# Patient Record
Sex: Female | Born: 1945 | Race: White | Hispanic: No | Smoking: Never smoker
Health system: Southern US, Community
[De-identification: ages and names within clinical notes are randomized; demographics above are authoritative.]

## PROBLEM LIST (undated history)

## (undated) DIAGNOSIS — Z889 Allergy status to unspecified drugs, medicaments and biological substances status: Secondary | ICD-10-CM

## (undated) DIAGNOSIS — M1990 Unspecified osteoarthritis, unspecified site: Secondary | ICD-10-CM

## (undated) DIAGNOSIS — Z9289 Personal history of other medical treatment: Secondary | ICD-10-CM

## (undated) DIAGNOSIS — I10 Essential (primary) hypertension: Secondary | ICD-10-CM

## (undated) DIAGNOSIS — K219 Gastro-esophageal reflux disease without esophagitis: Secondary | ICD-10-CM

## (undated) DIAGNOSIS — H3530 Unspecified macular degeneration: Secondary | ICD-10-CM

## (undated) HISTORY — PX: BREAST SURGERY: SHX581

## (undated) HISTORY — PX: TONSILLECTOMY: SUR1361

## (undated) HISTORY — PX: FOOT SURGERY: SHX648

## (undated) HISTORY — PX: OTHER SURGICAL HISTORY: SHX169

## (undated) HISTORY — PX: CERVICAL FUSION: SHX112

## (undated) HISTORY — PX: ABDOMINAL HYSTERECTOMY: SHX81

## (undated) HISTORY — PX: BACK SURGERY: SHX140

---

## 2014-01-22 ENCOUNTER — Encounter (HOSPITAL_COMMUNITY): Payer: Self-pay | Admitting: Pharmacy Technician

## 2014-01-23 NOTE — H&P (Signed)
TOTAL HIP ADMISSION H&P  Patient is admitted for right total hip arthroplasty.  Subjective:  Chief Complaint:   Right hip OA / pain  HPI: Karen Li, 68 y.o. female, has a history of pain and functional disability in the right hip(s) due to arthritis and patient has failed non-surgical conservative treatments for greater than 12 weeks to include NSAID's and/or analgesics, use of assistive devices and activity modification.  Onset of symptoms was abrupt starting in May 2015 with rapidlly worsening course since that time.The patient noted no past surgery on the right hip(s).  Patient currently rates pain in the right hip at 8 out of 10 with activity. Patient has night pain, worsening of pain with activity and weight bearing, trendelenberg gait, pain that interfers with activities of daily living and pain with passive range of motion. Patient has evidence of periarticular osteophytes and joint space narrowing by imaging studies. This condition presents safety issues increasing the risk of falls.  There is no current active infection.  Risks, benefits and expectations were discussed with the patient.  Risks including but not limited to the risk of anesthesia, blood clots, nerve damage, blood vessel damage, failure of the prosthesis, infection and up to and including death.  Patient understand the risks, benefits and expectations and wishes to proceed with surgery.   PCP: No primary provider on file.  D/C Plans:      Home with HHPT  Post-op Meds:       No Rx given  Tranexamic Acid:      To be given - IV    Decadron:      Is to be given  FYI:     ASA post-op   Norco post-op   Past Medical History  Diagnosis Date  . Hypertension   . H/O seasonal allergies   . GERD (gastroesophageal reflux disease)     tx. Prevacid" due to Indomethacin use"  . Arthritis     Osteoarthritis- hip, shoulders, fingers  . Transfusion history     9'13 -s/p cervical fusion  . 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 Tonsillectomy    . Breast surgery      '82- bil. breast cystectomy-benign  . Foot surgery Left     8'06-retained screw left heel.   . Back surgery      1'61,0'96 - Lumbar fusion -rods/ screws retained  . Cervical fusion      9'13 Cervical fusion [place] -retained plates/screws  . Abdominal hysterectomy      7'15 - [facility]-"prolapse"  . Cataract surgery Bilateral     "legally blind" can't read , can see limited     No Known Allergies   History  Substance Use Topics  . Smoking status: Never Smoker   . Smokeless tobacco: Not on file  . Alcohol Use: No     Comment: rare -social      Review of Systems  Constitutional: Negative.   HENT: Negative.   Eyes: Positive for blurred vision.  Respiratory: Negative.   Cardiovascular: Negative.   Gastrointestinal: Negative.   Genitourinary: Negative.   Musculoskeletal: Positive for joint pain.  Skin: Negative.   Neurological: Negative.   Endo/Heme/Allergies: Negative.   Psychiatric/Behavioral: Negative.     Objective:  Physical Exam  Constitutional: She is oriented to person, place, and time. She appears well-developed and well-nourished.  HENT:  Head: Normocephalic and atraumatic.  Eyes: Pupils are equal, round, and reactive to light.  Neck: Neck supple. No JVD present. No tracheal deviation present. No thyromegaly present.  Cardiovascular: Normal rate, regular rhythm, normal heart sounds and intact distal pulses.   Respiratory: Effort normal and breath sounds normal. No stridor. No respiratory distress. She has no wheezes.  GI: Soft. There is no tenderness. There is no guarding.  Musculoskeletal:       Right hip: She exhibits decreased range of motion, decreased strength, tenderness and bony tenderness. She exhibits no crepitus, no deformity and no laceration.  Lymphadenopathy:    She has no cervical adenopathy.   Neurological: She is alert and oriented to person, place, and time.  Skin: Skin is warm and dry.  Psychiatric: She has a normal mood and affect.     Imaging Review Plain radiographs demonstrate severe degenerative joint disease of the right hip(s). The bone quality appears to be good for age and reported activity level.  Assessment/Plan:  End stage arthritis, right hip(s)  The patient history, physical examination, clinical judgement of the provider and imaging studies are consistent with end stage degenerative joint disease of the right hip(s) and total hip arthroplasty is deemed medically necessary. The treatment options including medical management, injection therapy, arthroscopy and arthroplasty were discussed at length. The risks and benefits of total hip arthroplasty were presented and reviewed. The risks due to aseptic loosening, infection, stiffness, dislocation/subluxation,  thromboembolic complications and other imponderables were discussed.  The patient acknowledged the explanation, agreed to proceed with the plan and consent was signed. Patient is being admitted for inpatient treatment for surgery, pain control, PT, OT, prophylactic antibiotics, VTE prophylaxis, progressive ambulation and ADL's and discharge planning.The patient is planning to be discharged home with home health services.      Anastasio AuerbachMatthew S. Liborio Saccente   PA-C  01/23/2014, 5:44 PM

## 2014-01-23 NOTE — Patient Instructions (Addendum)
Karen Li  01/23/2014   Your procedure is scheduled on:  02/05/2014 Tuesday  -  [id number]  [facility].  Follow the Signs to Short Stay Center at 0515  am  Call this number if you have problems the morning of surgery: [phone]  01-25-14 HCPOA form provided today and placed with chart.   Remember:   Do not eat food or drink liquids after midnight.   Take these medicines the morning of surgery with A SIP OF WATER: Norvasc. Lipitor. Norco. Prevacid. Effexor. Use Afrin as needed(not while using Mupirocin ointment).   Do not wear jewelry, make-up or nail polish.  Do not wear lotions, powders, or perfumes. , deodorant   Do not shave 48 hours prior to surgery.   Do not bring valuables to the hospital.  Contacts, dentures or bridgework may not be worn into surgery.  Leave suitcase in the car. After surgery it may be brought to your room.  For patients admitted to the hospital, checkout time is 11:00 AM the day of discharge.   Transportation for discharge: Peg Daleen SnookFinlay, daughter - [phone] cell    [facility] - Preparing for Surgery Before surgery, you can play an important role.  Because skin is not sterile, your skin needs to be as free of germs as possible.  You can reduce the number of germs on your skin by washing with CHG (chlorahexidine gluconate) soap before surgery.  CHG is an antiseptic cleaner which kills germs and bonds with the skin to continue killing germs even after washing. Please DO NOT use if you have an allergy to CHG or antibacterial soaps.  If your skin becomes reddened/irritated stop using the CHG and inform your nurse when you arrive at Short Stay. Do not shave (including legs and underarms) for at least 48 hours prior to the first CHG shower.  You may shave your face/neck. Please follow these instructions carefully:  1.  Shower with CHG Soap the night before surgery and the  morning of Surgery.  2.  If you choose to wash your hair, wash your  hair first as usual with your  normal  shampoo.  3.  After you shampoo, rinse your hair and body thoroughly to remove the  shampoo.                           4.  Use CHG as you would any other liquid soap.  You can apply chg directly  to the skin and wash                       Gently with a scrungie or clean washcloth.  5.  Apply the CHG Soap to your body ONLY FROM THE NECK DOWN.   Do not use on face/ open                           Wound or open sores. Avoid contact with eyes, ears mouth and genitals (private parts).                       Wash face,  Genitals (private parts) with your normal soap.             6.  Wash thoroughly, paying special attention to the area where your surgery  will be performed.  7.  Thoroughly rinse your body with warm water  from the neck down.  8.  DO NOT shower/wash with your normal soap after using and rinsing off  the CHG Soap.                9.  Pat yourself dry with a clean towel.            10.  Wear clean pajamas.            11.  Place clean sheets on your bed the night of your first shower and do not  sleep with pets. Day of Surgery : Do not apply any lotions/deodorants the morning of surgery.  Please wear clean clothes to the hospital/surgery center.  FAILURE TO FOLLOW THESE INSTRUCTIONS MAY RESULT IN THE CANCELLATION OF YOUR SURGERY PATIENT SIGNATURE_________________________________  NURSE SIGNATURE__________________________________  ________________________________________________________________________  WHAT IS A BLOOD TRANSFUSION? Blood Transfusion Information  A transfusion is the replacement of blood or some of its parts. Blood is made up of multiple cells which provide different functions.  Red blood cells carry oxygen and are used for blood loss replacement.  White blood cells fight against infection.  Platelets control bleeding.  Plasma helps clot blood.  Other blood products are available for specialized needs, such as hemophilia or  other clotting disorders. BEFORE THE TRANSFUSION  Who gives blood for transfusions?   Healthy volunteers who are fully evaluated to make sure their blood is safe. This is blood bank blood. Transfusion therapy is the safest it has ever been in the practice of medicine. Before blood is taken from a donor, a complete history is taken to make sure that person has no history of diseases nor engages in risky social behavior (examples are intravenous drug use or sexual activity with multiple partners). The donor's travel history is screened to minimize risk of transmitting infections, such as malaria. The donated blood is tested for signs of infectious diseases, such as HIV and hepatitis. The blood is then tested to be sure it is compatible with you in order to minimize the chance of a transfusion reaction. If you or a relative donates blood, this is often done in anticipation of surgery and is not appropriate for emergency situations. It takes many days to process the donated blood. RISKS AND COMPLICATIONS Although transfusion therapy is very safe and saves many lives, the main dangers of transfusion include:   Getting an infectious disease.  Developing a transfusion reaction. This is an allergic reaction to something in the blood you were given. Every precaution is taken to prevent this. The decision to have a blood transfusion has been considered carefully by your caregiver before blood is given. Blood is not given unless the benefits outweigh the risks. AFTER THE TRANSFUSION  Right after receiving a blood transfusion, you will usually feel much better and more energetic. This is especially true if your red blood cells have gotten low (anemic). The transfusion raises the level of the red blood cells which carry oxygen, and this usually causes an energy increase.  The nurse administering the transfusion will monitor you carefully for complications. HOME CARE INSTRUCTIONS  No special instructions are  needed after a transfusion. You may find your energy is better. Speak with your caregiver about any limitations on activity for underlying diseases you may have. SEEK MEDICAL CARE IF:   Your condition is not improving after your transfusion.  You develop redness or irritation at the intravenous (IV) site. SEEK IMMEDIATE MEDICAL CARE IF:  Any of the following symptoms occur over the next  12 hours:  Shaking chills.  You have a temperature by mouth above 102 F (38.9 C), not controlled by medicine.  Chest, back, or muscle pain.  People around you feel you are not acting correctly or are confused.  Shortness of breath or difficulty breathing.  Dizziness and fainting.  You get a rash or develop hives.  You have a decrease in urine output.  Your urine turns a dark color or changes to pink, red, or brown. Any of the following symptoms occur over the next 10 days:  You have a temperature by mouth above 102 F (38.9 C), not controlled by medicine.  Shortness of breath.  Weakness after normal activity.  The white part of the eye turns yellow (jaundice).  You have a decrease in the amount of urine or are urinating less often.  Your urine turns a dark color or changes to pink, red, or brown. Document Released: 06/18/2000 Document Revised: 09/13/2011 Document Reviewed: 02/05/2008 ExitCare Patient Information 2014 [place], [place].  _______________________________________________________________________  Incentive Spirometer  An incentive spirometer is a tool that can help keep your lungs clear and active. This tool measures how well you are filling your lungs with each breath. Taking long deep breaths may help reverse or decrease the chance of developing breathing (pulmonary) problems (especially infection) following:  A long period of time when you are unable to move or be active. BEFORE THE PROCEDURE   If the spirometer includes an indicator to show your best effort, your  nurse or respiratory therapist will set it to a desired goal.  If possible, sit up straight or lean slightly forward. Try not to slouch.  Hold the incentive spirometer in an upright position. INSTRUCTIONS FOR USE  1. Sit on the edge of your bed if possible, or sit up as far as you can in bed or on a chair. 2. Hold the incentive spirometer in an upright position. 3. Breathe out normally. 4. Place the mouthpiece in your mouth and seal your lips tightly around it. 5. Breathe in slowly and as deeply as possible, raising the piston or the ball toward the top of the column. 6. Hold your breath for 3-5 seconds or for as long as possible. Allow the piston or ball to fall to the bottom of the column. 7. Remove the mouthpiece from your mouth and breathe out normally. 8. Rest for a few seconds and repeat Steps 1 through 7 at least 10 times every 1-2 hours when you are awake. Take your time and take a few normal breaths between deep breaths. 9. The spirometer may include an indicator to show your best effort. Use the indicator as a goal to work toward during each repetition. 10. After each set of 10 deep breaths, practice coughing to be sure your lungs are clear. If you have an incision (the cut made at the time of surgery), support your incision when coughing by placing a pillow or rolled up towels firmly against it. Once you are able to get out of bed, walk around indoors and cough well. You may stop using the incentive spirometer when instructed by your caregiver.  RISKS AND COMPLICATIONS  Take your time so you do not get dizzy or light-headed.  If you are in pain, you may need to take or ask for pain medication before doing incentive spirometry. It is harder to take a deep breath if you are having pain. AFTER USE  Rest and breathe slowly and easily.  It can be helpful to  keep track of a log of your progress. Your caregiver can provide you with a simple table to help with this. If you are using the  spirometer at home, follow these instructions: SEEK MEDICAL CARE IF:   You are having difficultly using the spirometer.  You have trouble using the spirometer as often as instructed.  Your pain medication is not giving enough relief while using the spirometer.  You develop fever of 100.5 F (38.1 C) or higher. SEEK IMMEDIATE MEDICAL CARE IF:   You cough up bloody sputum that had not been present before.  You develop fever of 102 F (38.9 C) or greater.  You develop worsening pain at or near the incision site. MAKE SURE YOU:   Understand these instructions.  Will watch your condition.  Will get help right away if you are not doing well or get worse. Document Released: 11/01/2006 Document Revised: 09/13/2011 Document Reviewed: 01/02/2007 ExitCare Patient Information 2014 [employer], [place].   ________________________________________________________________________    Please read over the following fact sheets that you were given: MRSA Information, coughing and deep breathing exercises, leg exercises

## 2014-01-25 ENCOUNTER — Encounter (HOSPITAL_COMMUNITY)
Admission: RE | Admit: 2014-01-25 | Discharge: 2014-01-25 | Disposition: A | Discharge status: Home / Self Care | Payer: Medicare Other | Source: Ambulatory Visit | Attending: Orthopedic Surgery | Admitting: Orthopedic Surgery

## 2014-01-25 ENCOUNTER — Encounter (HOSPITAL_COMMUNITY): Payer: Self-pay

## 2014-01-25 ENCOUNTER — Ambulatory Visit (HOSPITAL_COMMUNITY)
Admission: RE | Admit: 2014-01-25 | Discharge: 2014-01-25 | Disposition: A | Discharge status: Home / Self Care | Payer: Medicare Other | Source: Ambulatory Visit | Attending: Anesthesiology | Admitting: Anesthesiology

## 2014-01-25 DIAGNOSIS — I10 Essential (primary) hypertension: Secondary | ICD-10-CM | POA: Diagnosis not present

## 2014-01-25 DIAGNOSIS — Z01812 Encounter for preprocedural laboratory examination: Secondary | ICD-10-CM | POA: Diagnosis not present

## 2014-01-25 DIAGNOSIS — Z01818 Encounter for other preprocedural examination: Secondary | ICD-10-CM | POA: Diagnosis not present

## 2014-01-25 DIAGNOSIS — K219 Gastro-esophageal reflux disease without esophagitis: Secondary | ICD-10-CM | POA: Diagnosis not present

## 2014-01-25 DIAGNOSIS — J9819 Other pulmonary collapse: Secondary | ICD-10-CM | POA: Diagnosis not present

## 2014-01-25 HISTORY — DX: Unspecified osteoarthritis, unspecified site: M19.90

## 2014-01-25 HISTORY — DX: Essential (primary) hypertension: I10

## 2014-01-25 HISTORY — DX: Personal history of other medical treatment: Z92.89

## 2014-01-25 HISTORY — DX: Unspecified macular degeneration: H35.30

## 2014-01-25 HISTORY — DX: Allergy status to unspecified drugs, medicaments and biological substances: Z88.9

## 2014-01-25 HISTORY — DX: Gastro-esophageal reflux disease without esophagitis: K21.9

## 2014-01-25 LAB — CBC
HCT: 29.7 % — ABNORMAL LOW (ref 36.0–46.0)
Hemoglobin: 8.9 g/dL — ABNORMAL LOW (ref 12.0–15.0)
MCH: 20.8 pg — ABNORMAL LOW (ref 26.0–34.0)
MCHC: 30 g/dL (ref 30.0–36.0)
MCV: 69.4 fL — ABNORMAL LOW (ref 78.0–100.0)
Platelets: 612 10*3/uL — ABNORMAL HIGH (ref 150–400)
RBC: 4.28 MIL/uL (ref 3.87–5.11)
RDW: 18.1 % — ABNORMAL HIGH (ref 11.5–15.5)
WBC: 8.4 10*3/uL (ref 4.0–10.5)

## 2014-01-25 LAB — SURGICAL PCR SCREEN
MRSA, PCR: NEGATIVE
Staphylococcus aureus: NEGATIVE

## 2014-01-25 LAB — URINALYSIS, ROUTINE W REFLEX MICROSCOPIC
Glucose, UA: NEGATIVE mg/dL
HGB URINE DIPSTICK: NEGATIVE
KETONES UR: NEGATIVE mg/dL
Leukocytes, UA: NEGATIVE
Nitrite: NEGATIVE
Protein, ur: NEGATIVE mg/dL
Specific Gravity, Urine: 1.024 (ref 1.005–1.030)
UROBILINOGEN UA: 0.2 mg/dL (ref 0.0–1.0)
pH: 5.5 (ref 5.0–8.0)

## 2014-01-25 LAB — BASIC METABOLIC PANEL
Anion gap: 10 (ref 5–15)
BUN: 19 mg/dL (ref 6–23)
CALCIUM: 9.6 mg/dL (ref 8.4–10.5)
CHLORIDE: 102 meq/L (ref 96–112)
CO2: 26 meq/L (ref 19–32)
CREATININE: 0.75 mg/dL (ref 0.50–1.10)
GFR, EST NON AFRICAN AMERICAN: 86 mL/min — AB (ref >90)
GLUCOSE: 81 mg/dL (ref 70–99)
Potassium: 5.1 mEq/L (ref 3.7–5.3)
Sodium: 138 mEq/L (ref 137–147)

## 2014-01-25 LAB — APTT: aPTT: 27 seconds (ref 24–37)

## 2014-01-25 LAB — PROTIME-INR
INR: 1.02 (ref 0.00–1.49)
PROTHROMBIN TIME: 13.4 s (ref 11.6–15.2)

## 2014-01-25 NOTE — Pre-Procedure Instructions (Addendum)
01-25-14- EKG 01-14-14 with chart. Clearance notes - Dr. Lisbeth PlyBosken 01-09-14, Dr. Wynelle LinkFeld 01-14-14 , with chart. CXR done today. 01-25-14 1635 Note Hgb 8.9- labs viewable in Epic -labs done today. Note faxed to Dr. Charlann Boxerlin office pod [phone] .

## 2014-01-29 ENCOUNTER — Other Ambulatory Visit (HOSPITAL_COMMUNITY): Payer: Medicare Other

## 2014-02-04 NOTE — Anesthesia Preprocedure Evaluation (Addendum)
Anesthesia Evaluation  Patient identified by MRN, date of birth, ID band Patient awake    Reviewed: Allergy & Precautions, H&P , NPO status , Patient's Chart, lab work & pertinent test results  Airway Mallampati: II TM Distance: >3 FB Neck ROM: full    Dental no notable dental hx. (+) Teeth Intact, Dental Advisory Given   Pulmonary neg pulmonary ROS,  breath sounds clear to auscultation  Pulmonary exam normal       Cardiovascular hypertension, Pt. on medications Rhythm:regular Rate:Normal     Neuro/Psych negative neurological ROS  negative psych ROS   GI/Hepatic negative GI ROS, Neg liver ROS, GERD-  Medicated and Controlled,  Endo/Other  negative endocrine ROS  Renal/GU negative Renal ROS  negative genitourinary   Musculoskeletal   Abdominal   Peds  Hematology negative hematology ROS (+) anemia , hgb 8.9   Anesthesia Other Findings   Reproductive/Obstetrics negative OB ROS                          Anesthesia Physical Anesthesia Plan  ASA: II  Anesthesia Plan: General   Post-op Pain Management:    Induction: Intravenous  Airway Management Planned: Oral ETT  Additional Equipment:   Intra-op Plan:   Post-operative Plan: Extubation in OR  Informed Consent: I have reviewed the patients History and Physical, chart, labs and discussed the procedure including the risks, benefits and alternatives for the proposed anesthesia with the patient or authorized representative who has indicated his/her understanding and acceptance.   Dental Advisory Given  Plan Discussed with: CRNA and Surgeon  Anesthesia Plan Comments:        Anesthesia Quick Evaluation

## 2014-02-05 ENCOUNTER — Encounter (HOSPITAL_COMMUNITY): Payer: Medicare Other | Admitting: Anesthesiology

## 2014-02-05 ENCOUNTER — Encounter (HOSPITAL_COMMUNITY)
Admission: RE | Disposition: A | Discharge status: Home Health | Payer: Self-pay | Source: Ambulatory Visit | Attending: Orthopedic Surgery

## 2014-02-05 ENCOUNTER — Inpatient Hospital Stay (HOSPITAL_COMMUNITY): Payer: Medicare Other

## 2014-02-05 ENCOUNTER — Inpatient Hospital Stay (HOSPITAL_COMMUNITY): Payer: Medicare Other | Admitting: Anesthesiology

## 2014-02-05 ENCOUNTER — Inpatient Hospital Stay (HOSPITAL_COMMUNITY)
Admission: RE | Admit: 2014-02-05 | Discharge: 2014-02-07 | DRG: 470 | Disposition: A | Discharge status: Home Health | Payer: Medicare Other | Source: Ambulatory Visit | Attending: Orthopedic Surgery | Admitting: Orthopedic Surgery

## 2014-02-05 ENCOUNTER — Encounter (HOSPITAL_COMMUNITY): Payer: Self-pay | Admitting: *Deleted

## 2014-02-05 DIAGNOSIS — Z96649 Presence of unspecified artificial hip joint: Secondary | ICD-10-CM

## 2014-02-05 DIAGNOSIS — Z6825 Body mass index (BMI) 25.0-25.9, adult: Secondary | ICD-10-CM

## 2014-02-05 DIAGNOSIS — H548 Legal blindness, as defined in USA: Secondary | ICD-10-CM | POA: Diagnosis present

## 2014-02-05 DIAGNOSIS — M1610 Unilateral primary osteoarthritis, unspecified hip: Secondary | ICD-10-CM | POA: Diagnosis not present

## 2014-02-05 DIAGNOSIS — K219 Gastro-esophageal reflux disease without esophagitis: Secondary | ICD-10-CM | POA: Diagnosis present

## 2014-02-05 DIAGNOSIS — M169 Osteoarthritis of hip, unspecified: Principal | ICD-10-CM | POA: Diagnosis present

## 2014-02-05 DIAGNOSIS — H3530 Unspecified macular degeneration: Secondary | ICD-10-CM | POA: Diagnosis present

## 2014-02-05 DIAGNOSIS — D62 Acute posthemorrhagic anemia: Secondary | ICD-10-CM | POA: Diagnosis not present

## 2014-02-05 DIAGNOSIS — Z981 Arthrodesis status: Secondary | ICD-10-CM

## 2014-02-05 DIAGNOSIS — I10 Essential (primary) hypertension: Secondary | ICD-10-CM | POA: Diagnosis not present

## 2014-02-05 DIAGNOSIS — M25559 Pain in unspecified hip: Secondary | ICD-10-CM | POA: Diagnosis present

## 2014-02-05 DIAGNOSIS — E663 Overweight: Secondary | ICD-10-CM | POA: Diagnosis present

## 2014-02-05 HISTORY — PX: TOTAL HIP ARTHROPLASTY: SHX124

## 2014-02-05 LAB — ABO/RH: ABO/RH(D): A POS

## 2014-02-05 SURGERY — ARTHROPLASTY, HIP, TOTAL, ANTERIOR APPROACH
Anesthesia: General | Site: Hip | Laterality: Right

## 2014-02-05 MED ORDER — HYDROCODONE-ACETAMINOPHEN 7.5-325 MG PO TABS
1.0000 | ORAL_TABLET | ORAL | Status: DC
  Administered 2014-02-05 – 2014-02-06 (×4): 2 via ORAL
  Administered 2014-02-06: 1 via ORAL
  Administered 2014-02-06 (×2): 2 via ORAL
  Administered 2014-02-07: 1 via ORAL
  Filled 2014-02-05 (×9): qty 2
  Filled 2014-02-05: qty 1

## 2014-02-05 MED ORDER — MENTHOL 3 MG MT LOZG
1.0000 | LOZENGE | OROMUCOSAL | Status: DC | PRN
  Filled 2014-02-05: qty 9

## 2014-02-05 MED ORDER — SODIUM CHLORIDE 0.9 % IJ SOLN
INTRAMUSCULAR | Status: AC
  Filled 2014-02-05: qty 10

## 2014-02-05 MED ORDER — PANTOPRAZOLE SODIUM 20 MG PO TBEC
20.0000 mg | DELAYED_RELEASE_TABLET | Freq: Every day | ORAL | Status: DC
  Administered 2014-02-06 – 2014-02-07 (×2): 20 mg via ORAL
  Filled 2014-02-05 (×2): qty 1

## 2014-02-05 MED ORDER — CEFAZOLIN SODIUM-DEXTROSE 2-3 GM-% IV SOLR
2.0000 g | INTRAVENOUS | Status: AC
  Administered 2014-02-05: 2 g via INTRAVENOUS

## 2014-02-05 MED ORDER — FENTANYL CITRATE 0.05 MG/ML IJ SOLN
INTRAMUSCULAR | Status: AC
  Filled 2014-02-05: qty 5

## 2014-02-05 MED ORDER — ZOLPIDEM TARTRATE 5 MG PO TABS
5.0000 mg | ORAL_TABLET | Freq: Every evening | ORAL | Status: DC | PRN
  Administered 2014-02-05 – 2014-02-06 (×2): 5 mg via ORAL
  Filled 2014-02-05 (×2): qty 1

## 2014-02-05 MED ORDER — SUCCINYLCHOLINE CHLORIDE 20 MG/ML IJ SOLN
INTRAMUSCULAR | Status: DC | PRN
  Administered 2014-02-05: 100 mg via INTRAVENOUS

## 2014-02-05 MED ORDER — METHOCARBAMOL 500 MG PO TABS
500.0000 mg | ORAL_TABLET | Freq: Four times a day (QID) | ORAL | Status: DC | PRN
Start: 2014-02-05 — End: 2014-02-07
  Administered 2014-02-06 – 2014-02-07 (×4): 500 mg via ORAL
  Filled 2014-02-05 (×4): qty 1

## 2014-02-05 MED ORDER — LIDOCAINE HCL (CARDIAC) 20 MG/ML IV SOLN
INTRAVENOUS | Status: DC | PRN
  Administered 2014-02-05: 25 mg via INTRATRACHEAL
  Administered 2014-02-05: 75 mg via INTRAVENOUS

## 2014-02-05 MED ORDER — POLYETHYLENE GLYCOL 3350 17 G PO PACK
17.0000 g | PACK | Freq: Two times a day (BID) | ORAL | Status: DC
  Administered 2014-02-05 – 2014-02-07 (×4): 17 g via ORAL

## 2014-02-05 MED ORDER — MIDAZOLAM HCL 2 MG/2ML IJ SOLN
INTRAMUSCULAR | Status: AC
  Filled 2014-02-05: qty 2

## 2014-02-05 MED ORDER — LACTATED RINGERS IV SOLN
INTRAVENOUS | Status: DC | PRN
  Administered 2014-02-05 (×3): via INTRAVENOUS

## 2014-02-05 MED ORDER — VENLAFAXINE HCL ER 150 MG PO CP24
150.0000 mg | ORAL_CAPSULE | Freq: Every morning | ORAL | Status: DC
  Administered 2014-02-06 – 2014-02-07 (×2): 150 mg via ORAL
  Filled 2014-02-05 (×2): qty 1

## 2014-02-05 MED ORDER — ROCURONIUM BROMIDE 100 MG/10ML IV SOLN
INTRAVENOUS | Status: DC | PRN
  Administered 2014-02-05: 5 mg via INTRAVENOUS
  Administered 2014-02-05: 25 mg via INTRAVENOUS

## 2014-02-05 MED ORDER — DOCUSATE SODIUM 100 MG PO CAPS
100.0000 mg | ORAL_CAPSULE | Freq: Two times a day (BID) | ORAL | Status: DC
  Administered 2014-02-05 – 2014-02-07 (×4): 100 mg via ORAL

## 2014-02-05 MED ORDER — PROPOFOL 10 MG/ML IV BOLUS
INTRAVENOUS | Status: AC
  Filled 2014-02-05: qty 20

## 2014-02-05 MED ORDER — ROCURONIUM BROMIDE 100 MG/10ML IV SOLN
INTRAVENOUS | Status: AC
  Filled 2014-02-05: qty 1

## 2014-02-05 MED ORDER — PHENYLEPHRINE HCL 10 MG/ML IJ SOLN
INTRAMUSCULAR | Status: DC | PRN
  Administered 2014-02-05: 80 ug via INTRAVENOUS
  Administered 2014-02-05: 120 ug via INTRAVENOUS
  Administered 2014-02-05 (×2): 80 ug via INTRAVENOUS

## 2014-02-05 MED ORDER — DEXAMETHASONE SODIUM PHOSPHATE 10 MG/ML IJ SOLN
INTRAMUSCULAR | Status: DC | PRN
  Administered 2014-02-05: 10 mg via INTRAVENOUS

## 2014-02-05 MED ORDER — TRANEXAMIC ACID 100 MG/ML IV SOLN
1000.0000 mg | Freq: Once | INTRAVENOUS | Status: AC
  Administered 2014-02-05: 1000 mg via INTRAVENOUS
  Filled 2014-02-05 (×2): qty 10

## 2014-02-05 MED ORDER — CEFAZOLIN SODIUM-DEXTROSE 2-3 GM-% IV SOLR
INTRAVENOUS | Status: AC
  Filled 2014-02-05: qty 50

## 2014-02-05 MED ORDER — GLYCOPYRROLATE 0.2 MG/ML IJ SOLN
INTRAMUSCULAR | Status: AC
  Filled 2014-02-05: qty 3

## 2014-02-05 MED ORDER — HYDROMORPHONE HCL PF 1 MG/ML IJ SOLN
INTRAMUSCULAR | Status: DC | PRN
  Administered 2014-02-05 (×2): 1 mg via INTRAVENOUS

## 2014-02-05 MED ORDER — DIPHENHYDRAMINE HCL 25 MG PO CAPS: 25.0000 mg | ORAL_CAPSULE | Freq: Four times a day (QID) | ORAL | Status: DC | PRN

## 2014-02-05 MED ORDER — MIDAZOLAM HCL 5 MG/5ML IJ SOLN
INTRAMUSCULAR | Status: DC | PRN
  Administered 2014-02-05: 2 mg via INTRAVENOUS

## 2014-02-05 MED ORDER — LACTATED RINGERS IV SOLN: INTRAVENOUS | Status: DC

## 2014-02-05 MED ORDER — ONDANSETRON HCL 4 MG/2ML IJ SOLN
4.0000 mg | Freq: Four times a day (QID) | INTRAMUSCULAR | Status: DC | PRN
Start: 2014-02-05 — End: 2014-02-07

## 2014-02-05 MED ORDER — PROPOFOL 10 MG/ML IV BOLUS
INTRAVENOUS | Status: DC | PRN
  Administered 2014-02-05: 170 mg via INTRAVENOUS

## 2014-02-05 MED ORDER — ONDANSETRON HCL 4 MG/2ML IJ SOLN
INTRAMUSCULAR | Status: AC
  Filled 2014-02-05: qty 2

## 2014-02-05 MED ORDER — GLYCOPYRROLATE 0.2 MG/ML IJ SOLN
INTRAMUSCULAR | Status: DC | PRN
  Administered 2014-02-05: 0.4 mg via INTRAVENOUS

## 2014-02-05 MED ORDER — HYDROMORPHONE HCL PF 2 MG/ML IJ SOLN
INTRAMUSCULAR | Status: AC
  Filled 2014-02-05: qty 1

## 2014-02-05 MED ORDER — MAGNESIUM CITRATE PO SOLN: 1.0000 | Freq: Once | ORAL | Status: AC | PRN

## 2014-02-05 MED ORDER — HYDROMORPHONE HCL PF 1 MG/ML IJ SOLN: 0.5000 mg | INTRAMUSCULAR | Status: DC | PRN

## 2014-02-05 MED ORDER — METOCLOPRAMIDE HCL 5 MG/ML IJ SOLN: 5.0000 mg | Freq: Three times a day (TID) | INTRAMUSCULAR | Status: DC | PRN

## 2014-02-05 MED ORDER — LIDOCAINE HCL (CARDIAC) 20 MG/ML IV SOLN
INTRAVENOUS | Status: AC
  Filled 2014-02-05: qty 5

## 2014-02-05 MED ORDER — SODIUM CHLORIDE 0.9 % IR SOLN
Status: DC | PRN
  Administered 2014-02-05: 1000 mL

## 2014-02-05 MED ORDER — CHLORHEXIDINE GLUCONATE 4 % EX LIQD: 60.0000 mL | Freq: Once | CUTANEOUS | Status: DC

## 2014-02-05 MED ORDER — SUFENTANIL CITRATE 50 MCG/ML IV SOLN
INTRAVENOUS | Status: AC
  Filled 2014-02-05: qty 1

## 2014-02-05 MED ORDER — NEOSTIGMINE METHYLSULFATE 10 MG/10ML IV SOLN
INTRAVENOUS | Status: DC | PRN
  Administered 2014-02-05: 3 mg via INTRAVENOUS

## 2014-02-05 MED ORDER — PHENYLEPHRINE 40 MCG/ML (10ML) SYRINGE FOR IV PUSH (FOR BLOOD PRESSURE SUPPORT)
PREFILLED_SYRINGE | INTRAVENOUS | Status: AC
  Filled 2014-02-05: qty 10

## 2014-02-05 MED ORDER — DEXAMETHASONE SODIUM PHOSPHATE 10 MG/ML IJ SOLN
INTRAMUSCULAR | Status: AC
  Filled 2014-02-05: qty 1

## 2014-02-05 MED ORDER — ONDANSETRON HCL 4 MG PO TABS: 4.0000 mg | ORAL_TABLET | Freq: Four times a day (QID) | ORAL | Status: DC | PRN

## 2014-02-05 MED ORDER — DEXAMETHASONE SODIUM PHOSPHATE 10 MG/ML IJ SOLN
10.0000 mg | Freq: Once | INTRAMUSCULAR | Status: AC
  Administered 2014-02-06: 10 mg via INTRAVENOUS
  Filled 2014-02-05: qty 1

## 2014-02-05 MED ORDER — ALUM & MAG HYDROXIDE-SIMETH 200-200-20 MG/5ML PO SUSP: 30.0000 mL | ORAL | Status: DC | PRN

## 2014-02-05 MED ORDER — NEOSTIGMINE METHYLSULFATE 10 MG/10ML IV SOLN
INTRAVENOUS | Status: AC
  Filled 2014-02-05: qty 1

## 2014-02-05 MED ORDER — VENLAFAXINE HCL ER 75 MG PO CP24
75.0000 mg | ORAL_CAPSULE | Freq: Every evening | ORAL | Status: DC
  Administered 2014-02-05 – 2014-02-06 (×2): 75 mg via ORAL
  Filled 2014-02-05 (×4): qty 1

## 2014-02-05 MED ORDER — DEXTROSE 5 % IV SOLN
500.0000 mg | Freq: Four times a day (QID) | INTRAVENOUS | Status: DC | PRN
  Administered 2014-02-05: 500 mg via INTRAVENOUS
  Filled 2014-02-05: qty 5

## 2014-02-05 MED ORDER — POTASSIUM CHLORIDE 2 MEQ/ML IV SOLN
100.0000 mL/h | INTRAVENOUS | Status: DC
  Administered 2014-02-05: 100 mL/h via INTRAVENOUS
  Filled 2014-02-05 (×7): qty 1000

## 2014-02-05 MED ORDER — FENTANYL CITRATE 0.05 MG/ML IJ SOLN
INTRAMUSCULAR | Status: DC | PRN
  Administered 2014-02-05 (×2): 100 ug via INTRAVENOUS
  Administered 2014-02-05: 50 ug via INTRAVENOUS
  Administered 2014-02-05: 100 ug via INTRAVENOUS

## 2014-02-05 MED ORDER — FERROUS SULFATE 325 (65 FE) MG PO TABS
325.0000 mg | ORAL_TABLET | Freq: Three times a day (TID) | ORAL | Status: DC
Start: 2014-02-05 — End: 2014-02-07
  Administered 2014-02-05 – 2014-02-07 (×4): 325 mg via ORAL
  Filled 2014-02-05 (×9): qty 1

## 2014-02-05 MED ORDER — SUFENTANIL CITRATE 50 MCG/ML IV SOLN
INTRAVENOUS | Status: DC | PRN
  Administered 2014-02-05: 15 ug via INTRAVENOUS
  Administered 2014-02-05 (×3): 10 ug via INTRAVENOUS
  Administered 2014-02-05: 5 ug via INTRAVENOUS

## 2014-02-05 MED ORDER — FENTANYL CITRATE 0.05 MG/ML IJ SOLN
INTRAMUSCULAR | Status: AC
  Filled 2014-02-05: qty 2

## 2014-02-05 MED ORDER — ASPIRIN EC 325 MG PO TBEC
325.0000 mg | DELAYED_RELEASE_TABLET | Freq: Two times a day (BID) | ORAL | Status: DC
  Administered 2014-02-06 – 2014-02-07 (×3): 325 mg via ORAL
  Filled 2014-02-05 (×5): qty 1

## 2014-02-05 MED ORDER — HYDROMORPHONE HCL PF 1 MG/ML IJ SOLN: 0.2500 mg | INTRAMUSCULAR | Status: DC | PRN

## 2014-02-05 MED ORDER — AMLODIPINE BESYLATE 10 MG PO TABS
10.0000 mg | ORAL_TABLET | Freq: Every morning | ORAL | Status: DC
  Administered 2014-02-07: 10 mg via ORAL
  Filled 2014-02-05 (×2): qty 1

## 2014-02-05 MED ORDER — PHENOL 1.4 % MT LIQD
1.0000 | OROMUCOSAL | Status: DC | PRN
  Filled 2014-02-05: qty 177

## 2014-02-05 MED ORDER — METOCLOPRAMIDE HCL 10 MG PO TABS: 5.0000 mg | ORAL_TABLET | Freq: Three times a day (TID) | ORAL | Status: DC | PRN

## 2014-02-05 MED ORDER — BISACODYL 10 MG RE SUPP: 10.0000 mg | Freq: Every day | RECTAL | Status: DC | PRN

## 2014-02-05 MED ORDER — DEXAMETHASONE SODIUM PHOSPHATE 10 MG/ML IJ SOLN: 10.0000 mg | Freq: Once | INTRAMUSCULAR | Status: DC

## 2014-02-05 MED ORDER — VENLAFAXINE HCL ER 75 MG PO CP24
75.0000 mg | ORAL_CAPSULE | Freq: Every day | ORAL | Status: DC
  Filled 2014-02-05: qty 2

## 2014-02-05 MED ORDER — CELECOXIB 200 MG PO CAPS
200.0000 mg | ORAL_CAPSULE | Freq: Two times a day (BID) | ORAL | Status: DC
  Administered 2014-02-05 – 2014-02-07 (×5): 200 mg via ORAL
  Filled 2014-02-05 (×6): qty 1

## 2014-02-05 MED ORDER — ATORVASTATIN CALCIUM 40 MG PO TABS
40.0000 mg | ORAL_TABLET | Freq: Every evening | ORAL | Status: DC
  Administered 2014-02-05: 40 mg via ORAL
  Filled 2014-02-05 (×3): qty 1

## 2014-02-05 MED ORDER — ONDANSETRON HCL 4 MG/2ML IJ SOLN
INTRAMUSCULAR | Status: DC | PRN
  Administered 2014-02-05: 4 mg via INTRAVENOUS

## 2014-02-05 MED ORDER — STERILE WATER FOR IRRIGATION IR SOLN
Status: DC | PRN
  Administered 2014-02-05: 1500 mL

## 2014-02-05 MED ORDER — CEFAZOLIN SODIUM-DEXTROSE 2-3 GM-% IV SOLR
2.0000 g | Freq: Four times a day (QID) | INTRAVENOUS | Status: AC
  Administered 2014-02-05 (×2): 2 g via INTRAVENOUS
  Filled 2014-02-05 (×2): qty 50

## 2014-02-05 MED ORDER — OXYMETAZOLINE HCL 0.05 % NA SOLN
1.0000 | Freq: Two times a day (BID) | NASAL | Status: DC | PRN
  Filled 2014-02-05: qty 15

## 2014-02-05 MED ORDER — RISAQUAD PO CAPS
1.0000 | ORAL_CAPSULE | Freq: Every day | ORAL | Status: DC
  Administered 2014-02-06 – 2014-02-07 (×2): 1 via ORAL
  Filled 2014-02-05 (×2): qty 1

## 2014-02-05 SURGICAL SUPPLY — 37 items
BAG ZIPLOCK 12X15 (MISCELLANEOUS) IMPLANT
CAPT HIP PF COP ×3 IMPLANT
COVER PERINEAL POST (MISCELLANEOUS) ×3 IMPLANT
DERMABOND ADVANCED (GAUZE/BANDAGES/DRESSINGS) ×2
DERMABOND ADVANCED .7 DNX12 (GAUZE/BANDAGES/DRESSINGS) ×1 IMPLANT
DRAPE C-ARM 42X120 X-RAY (DRAPES) ×3 IMPLANT
DRAPE STERI IOBAN 125X83 (DRAPES) ×3 IMPLANT
DRAPE U-SHAPE 47X51 STRL (DRAPES) ×9 IMPLANT
DRSG AQUACEL AG ADV 3.5X10 (GAUZE/BANDAGES/DRESSINGS) ×3 IMPLANT
DURAPREP 26ML APPLICATOR (WOUND CARE) ×3 IMPLANT
ELECT BLADE TIP CTD 4 INCH (ELECTRODE) ×3 IMPLANT
ELECT PENCIL ROCKER SW 15FT (MISCELLANEOUS) IMPLANT
ELECT REM PT RETURN 15FT ADLT (MISCELLANEOUS) IMPLANT
ELECT REM PT RETURN 9FT ADLT (ELECTROSURGICAL) ×3
ELECTRODE REM PT RTRN 9FT ADLT (ELECTROSURGICAL) ×1 IMPLANT
FACESHIELD WRAPAROUND (MASK) ×12 IMPLANT
GLOVE BIOGEL PI IND STRL 7.5 (GLOVE) ×1 IMPLANT
GLOVE BIOGEL PI IND STRL 8 (GLOVE) ×1 IMPLANT
GLOVE BIOGEL PI INDICATOR 7.5 (GLOVE) ×2
GLOVE BIOGEL PI INDICATOR 8 (GLOVE) ×2
GLOVE ECLIPSE 8.0 STRL XLNG CF (GLOVE) ×3 IMPLANT
GLOVE ORTHO TXT STRL SZ7.5 (GLOVE) ×6 IMPLANT
GOWN SPEC L3 XXLG W/TWL (GOWN DISPOSABLE) ×3 IMPLANT
GOWN STRL REUS W/TWL LRG LVL3 (GOWN DISPOSABLE) ×3 IMPLANT
HOLDER FOLEY CATH W/STRAP (MISCELLANEOUS) ×3 IMPLANT
KIT BASIN OR (CUSTOM PROCEDURE TRAY) ×3 IMPLANT
PACK TOTAL JOINT (CUSTOM PROCEDURE TRAY) ×3 IMPLANT
SAW OSC TIP CART 19.5X105X1.3 (SAW) ×3 IMPLANT
SUT MNCRL AB 4-0 PS2 18 (SUTURE) ×3 IMPLANT
SUT VIC AB 1 CT1 36 (SUTURE) ×9 IMPLANT
SUT VIC AB 2-0 CT1 27 (SUTURE) ×4
SUT VIC AB 2-0 CT1 TAPERPNT 27 (SUTURE) ×2 IMPLANT
SUT VLOC 180 0 24IN GS25 (SUTURE) ×3 IMPLANT
TOWEL OR 17X26 10 PK STRL BLUE (TOWEL DISPOSABLE) ×3 IMPLANT
TOWEL OR NON WOVEN STRL DISP B (DISPOSABLE) IMPLANT
TRAY FOLEY CATH 14FRSI W/METER (CATHETERS) ×3 IMPLANT
WATER STERILE IRR 1500ML POUR (IV SOLUTION) ×3 IMPLANT

## 2014-02-05 NOTE — Progress Notes (Signed)
[facility] is providing the following services: Patient declined rw and commode - has both at home.   If patient discharges after hours, please call [phone].   Renard HamperLecretia Williamson 02/05/2014, 12:28 PM

## 2014-02-05 NOTE — Interval H&P Note (Signed)
History and Physical Interval Note:  02/05/2014 6:48 AM  Karen Li  has presented today for surgery, with the diagnosis of RIGHT HIP OA  The various methods of treatment have been discussed with the patient and family. After consideration of risks, benefits and other options for treatment, the patient has consented to  Procedure(s): RIGHT TOTAL HIP ARTHROPLASTY ANTERIOR APPROACH (Right) as a surgical intervention .  The patient's history has been reviewed, patient examined, no change in status, stable for surgery.  I have reviewed the patient's chart and labs.  Questions were answered to the patient's satisfaction.     Shelda PalLIN,Garvey Westcott D

## 2014-02-05 NOTE — Op Note (Signed)
NAME:  Karen Li                ACCOUNT NO.: [id number]      MEDICAL RECORD NO.: [id number]      FACILITY:  [facility]      PHYSICIAN:  Durene Romans D  DATE OF BIRTH:  1946/05/21     DATE OF PROCEDURE:  02/05/2014                                 OPERATIVE REPORT         PREOPERATIVE DIAGNOSIS: Right  hip osteoarthritis.      POSTOPERATIVE DIAGNOSIS:  Right hip osteoarthritis.      PROCEDURE:  Right total hip replacement through an anterior approach   utilizing DePuy THR system, component size 52mm pinnacle cup, a size 36+4 neutral   Altrex liner, a size 3 Hi Tri Lock stem with a 36+1.5 delta ceramic   ball.      SURGEON:  Madlyn Frankel. Charlann Boxer, M.D.      ASSISTANT:  Lanney Gins, PA-C     ANESTHESIA:  General.      SPECIMENS:  None.      COMPLICATIONS:  None.      BLOOD LOSS:  275 cc     DRAINS:  None.      INDICATION OF THE PROCEDURE:  Karen Li is a 68 y.o. female who had   presented to office for evaluation of right hip pain.  Radiographs revealed   progressive degenerative changes with bone-on-bone   articulation to the  hip joint.  The patient had painful limited range of   motion significantly affecting their overall quality of life.  The patient was failing to    respond to conservative measures, and at this point was ready   to proceed with more definitive measures.  The patient has noted progressive   degenerative changes in his hip, progressive problems and dysfunction   with regarding the hip prior to surgery.  Consent was obtained for   benefit of pain relief.  Specific risk of infection, DVT, component   failure, dislocation, need for revision surgery, as well discussion of   the anterior versus posterior approach were reviewed.  Consent was   obtained for benefit of anterior pain relief through an anterior   approach.      PROCEDURE IN DETAIL:  The patient was brought to operative theater.   Once adequate anesthesia, preoperative  antibiotics, 2gm of Ancef administered.   The patient was positioned supine on the OSI Hanna table.  Once adequate   padding of boney process was carried out, we had predraped out the hip, and  used fluoroscopy to confirm orientation of the pelvis and position.      The right hip was then prepped and draped from proximal iliac crest to   mid thigh with shower curtain technique.      Time-out was performed identifying the patient, planned procedure, and   extremity.     An incision was then made 2 cm distal and lateral to the   anterior superior iliac spine extending over the orientation of the   tensor fascia lata muscle and sharp dissection was carried down to the   fascia of the muscle and protractor placed in the soft tissues.      The fascia was then incised.  The muscle belly was identified and swept  laterally and retractor placed along the superior neck.  Following   cauterization of the circumflex vessels and removing some pericapsular   fat, a second cobra retractor was placed on the inferior neck.  A third   retractor was placed on the anterior acetabulum after elevating the   anterior rectus.  A L-capsulotomy was along the line of the   superior neck to the trochanteric fossa, then extended proximally and   distally.  Tag sutures were placed and the retractors were then placed   intracapsular.  We then identified the trochanteric fossa and   orientation of my neck cut, confirmed this radiographically   and then made a neck osteotomy with the femur on traction.  The femoral   head was removed without difficulty or complication.  Traction was let   off and retractors were placed posterior and anterior around the   acetabulum.      The labrum and foveal tissue were debrided.  I began reaming with a 47mm   reamer and reamed up to 51mm reamer with good bony bed preparation and a 52mm   cup was chosen.  The final 52mm Pinnacle cup was then impacted under fluoroscopy  to confirm  the depth of penetration and orientation with respect to   abduction.  A screw was placed followed by the hole eliminator.  The final   36+4 neutral Altrex liner was impacted with good visualized rim fit.  The cup was positioned anatomically within the acetabular portion of the pelvis.      At this point, the femur was rolled at 80 degrees.  Further capsule was   released off the inferior aspect of the femoral neck.  I then   released the superior capsule proximally.  The hook was placed laterally   along the femur and elevated manually and held in position with the bed   hook.  The leg was then extended and adducted with the leg rolled to 100   degrees of external rotation.  Once the proximal femur was fully   exposed, I used a box osteotome to set orientation.  I then began   broaching with the starting chili pepper broach and passed this by hand and then broached up to 3.  With the 3 broach in place I chose a high offset neck and did a trial reduction with a 36+1.5 head ball trial.  The offset was appropriate, leg lengths   appeared to be equal, confirmed radiographically.   Given these findings, I went ahead and dislocated the hip, repositioned all   retractors and positioned the right hip in the extended and abducted position.  The final 3 Hi Tri Lock stem was   chosen and it was impacted down to the level of neck cut.  Based on this   and the trial reduction, a 36+4 delta ceramic ball was chosen and   impacted onto a clean and dry trunnion, and the hip was reduced.  The   hip had been irrigated throughout the case again at this point.  I did   reapproximate the superior capsular leaflet to the anterior leaflet   using #1 Vicryl.  The fascia of the   tensor fascia lata muscle was then reapproximated using #1 Vicryl and #0 V-lock sutures.  The   remaining wound was closed with 2-0 Vicryl and running 4-0 Monocryl.   The hip was cleaned, dried, and dressed sterilely using Dermabond and    Aquacel dressing.  She was then  brought   to recovery room in stable condition tolerating the procedure well.    Lanney GinsMatthew Babish, PA-C was present for the entirety of the case involved from   preoperative positioning, perioperative retractor management, general   facilitation of the case, as well as primary wound closure as assistant.            Madlyn FrankelMatthew D. Charlann Boxerlin, M.D.        02/05/2014 8:49 AM

## 2014-02-05 NOTE — Anesthesia Postprocedure Evaluation (Signed)
  Anesthesia Post-op Note  Patient: Karen Li  Procedure(s) Performed: Procedure(s) (LRB): RIGHT TOTAL HIP ARTHROPLASTY ANTERIOR APPROACH (Right)  Patient Location: PACU  Anesthesia Type: General  Level of Consciousness: awake and alert   Airway and Oxygen Therapy: Patient Spontanous Breathing  Post-op Pain: mild  Post-op Assessment: Post-op Vital signs reviewed, Patient's Cardiovascular Status Stable, Respiratory Function Stable, Patent Airway and No signs of Nausea or vomiting  Last Vitals:  Filed Vitals:   02/05/14 1315  BP: 106/71  Pulse: 105  Temp: 36.7 C  Resp: 16    Post-op Vital Signs: stable   Complications: No apparent anesthesia complications
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
## 2014-02-05 NOTE — Plan of Care (Signed)
Problem: Consults Goal: Diagnosis- Total Joint Replacement Right anterior hip     

## 2014-02-05 NOTE — Transfer of Care (Signed)
Immediate Anesthesia Transfer of Care Note  Patient: Karen Li  Procedure(s) Performed: Procedure(s): RIGHT TOTAL HIP ARTHROPLASTY ANTERIOR APPROACH (Right)  Patient Location: PACU  Anesthesia Type:General  Level of Consciousness: awake, alert , oriented and patient cooperative  Airway & Oxygen Therapy: Patient Spontanous Breathing and Patient connected to face mask oxygen  Post-op Assessment: Report given to PACU RN, Post -op Vital signs reviewed and stable and Patient moving all extremities X 4  Post vital signs: stable  Complications: No apparent anesthesia complications

## 2014-02-06 DIAGNOSIS — D62 Acute posthemorrhagic anemia: Secondary | ICD-10-CM | POA: Diagnosis not present

## 2014-02-06 DIAGNOSIS — E663 Overweight: Secondary | ICD-10-CM | POA: Diagnosis present

## 2014-02-06 LAB — BASIC METABOLIC PANEL
Anion gap: 10 (ref 5–15)
BUN: 10 mg/dL (ref 6–23)
CHLORIDE: 105 meq/L (ref 96–112)
CO2: 25 mEq/L (ref 19–32)
CREATININE: 0.62 mg/dL (ref 0.50–1.10)
Calcium: 8.4 mg/dL (ref 8.4–10.5)
GFR calc Af Amer: >90 mL/min (ref >90)
GFR calc non Af Amer: >90 mL/min (ref >90)
Glucose, Bld: 140 mg/dL — ABNORMAL HIGH (ref 70–99)
Potassium: 4.2 mEq/L (ref 3.7–5.3)
Sodium: 140 mEq/L (ref 137–147)

## 2014-02-06 LAB — PREPARE RBC (CROSSMATCH)

## 2014-02-06 LAB — CBC
HEMATOCRIT: 21.9 % — AB (ref 36.0–46.0)
Hemoglobin: 6.6 g/dL — CL (ref 12.0–15.0)
MCH: 21.3 pg — ABNORMAL LOW (ref 26.0–34.0)
MCHC: 30.1 g/dL (ref 30.0–36.0)
MCV: 70.6 fL — AB (ref 78.0–100.0)
Platelets: 397 10*3/uL (ref 150–400)
RBC: 3.1 MIL/uL — ABNORMAL LOW (ref 3.87–5.11)
RDW: 18.4 % — ABNORMAL HIGH (ref 11.5–15.5)
WBC: 15.1 10*3/uL — AB (ref 4.0–10.5)

## 2014-02-06 MED ORDER — FERROUS SULFATE 325 (65 FE) MG PO TABS: 325.0000 mg | ORAL_TABLET | Freq: Three times a day (TID) | ORAL | Status: AC

## 2014-02-06 MED ORDER — METHOCARBAMOL 500 MG PO TABS: 500.0000 mg | ORAL_TABLET | Freq: Four times a day (QID) | ORAL | Status: DC | PRN

## 2014-02-06 MED ORDER — SODIUM CHLORIDE 0.9 % IV SOLN
Freq: Once | INTRAVENOUS | Status: AC
  Administered 2014-02-06: 12:00:00 via INTRAVENOUS

## 2014-02-06 MED ORDER — DSS 100 MG PO CAPS: 100.0000 mg | ORAL_CAPSULE | Freq: Two times a day (BID) | ORAL | Status: DC

## 2014-02-06 MED ORDER — ASPIRIN 325 MG PO TBEC: 325.0000 mg | DELAYED_RELEASE_TABLET | Freq: Two times a day (BID) | ORAL | Status: AC

## 2014-02-06 MED ORDER — POLYETHYLENE GLYCOL 3350 17 G PO PACK: 17.0000 g | PACK | Freq: Two times a day (BID) | ORAL | Status: AC

## 2014-02-06 MED ORDER — HYDROCODONE-ACETAMINOPHEN 7.5-325 MG PO TABS: 1.0000 | ORAL_TABLET | ORAL | Status: AC

## 2014-02-06 NOTE — Progress Notes (Signed)
OT Cancellation Note  Patient Details Name: Karen SiresLora C Li MRN: [id number] DOB: 25-Oct-1945   Cancelled Treatment:    Reason Eval/Treat Not Completed: Medical issues which prohibited therapy - Pt with Hbg 6.6.  Pt to receive 2 units of blood.  Will return this pm to attempt OT eval.  Angelene GiovanniConarpe, Kaylyne Axton M Akari Defelice Glasgowonarpe, OTR/L [phone]  02/06/2014, 10:37 AM

## 2014-02-06 NOTE — Progress Notes (Signed)
Physical Therapy Treatment Patient Details Name: Karen Li MRN: [id number] DOB: 1945-08-14 Today's Date: 02/06/2014    History of Present Illness [name]    PT Comments    No symptoms of low HGB.progressing well.  Follow Up Recommendations  Home health PT;Supervision for mobility/OOB     Equipment Recommendations  None recommended by PT    Recommendations for Other Services       Precautions / Restrictions Precautions Precautions: Fall    Mobility  Bed Mobility   Bed Mobility: Supine to Sit     Supine to sit: Min assist     General bed mobility comments: cues for technique, assist for  getting trunk  to upright  Transfers   Equipment used: Rolling walker (2 wheeled) Transfers: Sit to/from Stand Sit to Stand: Min guard         General transfer comment: cues for sequence and for posture., UE and R leg position  Ambulation/Gait Ambulation/Gait assistance: Min assist Ambulation Distance (Feet): 150 Feet Assistive device: Rolling walker (2 wheeled) Gait Pattern/deviations: Step-through pattern     General Gait Details: cues for sequence and posture.   Stairs            Wheelchair Mobility    Modified Rankin (Stroke Patients Only)       Balance                                    Cognition Arousal/Alertness: Awake/alert                          Exercises Total Joint Exercises Ankle Circles/Pumps: AROM;Both;Supine Quad Sets: AROM;Right;10 reps;Supine Short Arc Quad: AROM;Right;10 reps Heel Slides: AROM;Right;10 reps Hip ABduction/ADduction: AAROM;Right;10 reps    General Comments        Pertinent Vitals/Pain No pain    Home Living                      Prior Function            PT Goals (current goals can now be found in the care plan section) Progress towards PT goals: Progressing toward goals    Frequency  7X/week    PT Plan Current plan remains appropriate    Co-evaluation             End of Session   Activity Tolerance: Patient tolerated treatment well Patient left: in chair;with call bell/phone within reach     Time: 0900-0931 PT Time Calculation (min): 31 min  Charges:  $Gait Training: 8-22 mins $Therapeutic Exercise: 8-22 mins                    G Codes:      Rada HayHill, [name] [name] 02/06/2014, 3:35 PM

## 2014-02-06 NOTE — Progress Notes (Signed)
CARE MANAGEMENT NOTE 02/06/2014  Patient:  Karen Li,Karen Li   Account Number:  [id number]  Date Initiated:  02/06/2014  Documentation initiated by:  DAVIS,RHONDA  Subjective/Objective Assessment:   pt had rt. anter. hip [id number]/hgb dropped to 6.6 am of [id number] -receiving prbc     Action/Plan:   home with physical therapy through [facility]/patient has all needed equipment at home.   Anticipated DC Date:  02/08/2014   Anticipated DC Plan:  HOME W HOME HEALTH SERVICES  In-house referral  NA      DC Planning Services  CM consult      [facility]  [facility]   Choice offered to / List presented to:  Li-1 Patient   DME arranged  NA      DME agency  NA     HH arranged  HH-2 PT      [facility]   Status of service:  In process, will continue to follow Medicare Important Message given?  NA - LOS <3 / Initial given by admissions (If response is "NO", the following Medicare IM given date fields will be blank) Date Medicare IM given:   Medicare IM given by:   Date Additional Medicare IM given:   Additional Medicare IM given by:    Discharge Disposition:    Per UR Regulation:  Reviewed for med. necessity/level of care/duration of stay  If discussed at Long Length of Stay Meetings, dates discussed:    Comments:  [id number]/Rhonda Stark JockDavis, RN, BSN, [place] [phone] Chart Reviewed for discharge and hospital needs. Discharge needs at time of review: None present will follow for needs. Review of patient progress due on [id number]

## 2014-02-06 NOTE — Progress Notes (Signed)
OT Cancellation Note  Patient Details Name: Karen Li MRN: [id number] DOB: August 23, 1945   Cancelled Treatment:     Attempted again this pm, pt's RN reports pt still getting first unit of blood and pt fatigued and just back to bed.  Will reattempt tomorrow.  Angelene GiovanniConarpe, Rana Adorno M Kenniya Westrich Canjilononarpe, OTR/L [phone]  02/06/2014, 2:48 PM

## 2014-02-06 NOTE — Progress Notes (Signed)
Physical Therapy Treatment Patient Details Name: Sherene SiresLora C Wojtkiewicz MRN: [id number] DOB: 1946/03/01 Today's Date: 02/06/2014    History of Present Illness [name]    PT Comments    Progressing well. Feels good.   Follow Up Recommendations  Home health PT;Supervision for mobility/OOB     Equipment Recommendations  None recommended by PT    Recommendations for Other Services       Precautions / Restrictions Precautions Precautions: Fall    Mobility  Bed Mobility   Bed Mobility: Supine to Sit     Supine to sit: Min guard     General bed mobility comments: cues for technique, assist for  getting trunk  to upright  Transfers Overall transfer level: Needs assistance Equipment used: Rolling walker (2 wheeled) Transfers: Sit to/from Stand Sit to Stand: Min guard         General transfer comment: cues for sequence and for posture., UE and R leg position  Ambulation/Gait Ambulation/Gait assistance: Min guard Ambulation Distance (Feet): 200 Feet Assistive device: Rolling walker (2 wheeled) Gait Pattern/deviations: Step-through pattern     General Gait Details: cues for sequence and posture.   Stairs            Wheelchair Mobility    Modified Rankin (Stroke Patients Only)       Balance                                    Cognition Arousal/Alertness: Awake/alert                          Exercises Total Joint Exercises Ankle Circles/Pumps: AROM;Both;Supine Quad Sets: AROM;Right;10 reps;Supine Short Arc Quad: AROM;Right;10 reps Heel Slides: AROM;Right;10 reps Hip ABduction/ADduction: AAROM;Right;10 reps    General Comments        Pertinent Vitals/Pain     Home Living                      Prior Function            PT Goals (current goals can now be found in the care plan section) Progress towards PT goals: Progressing toward goals    Frequency  7X/week    PT Plan Current plan remains appropriate     Co-evaluation             End of Session   Activity Tolerance: Patient tolerated treatment well Patient left: in chair;with call bell/phone within reach     Time: 1459-1513 PT Time Calculation (min): 14 min  Charges:  $Gait Training: 8-22 mins $Therapeutic Exercise: 8-22 mins                    G Codes:      Rada HayHill, Shilee Biggs [name] 02/06/2014, 3:38 PM

## 2014-02-06 NOTE — Progress Notes (Signed)
   Subjective: 1 Day Post-Op Procedure(s) (LRB): RIGHT TOTAL HIP ARTHROPLASTY ANTERIOR APPROACH (Right)   Patient reports pain as mild, pain controlled with medication. No events throughout the night. Feeling a little weak this morning. Discussed transfusing 2 units of blood.  Objective:   VITALS:   Filed Vitals:   02/06/14 0530  BP: 109/70  Pulse: 103  Temp: 98.3 F (36.8 C)  Resp: 16    Dorsiflexion/Plantar flexion intact Incision: dressing C/D/I No cellulitis present Compartment soft  LABS  Recent Labs  02/06/14 0440  HGB 6.6*  HCT 21.9*  WBC 15.1*  PLT 397     Recent Labs  02/06/14 0440  NA 140  K 4.2  BUN 10  CREATININE 0.62  GLUCOSE 140*     Assessment/Plan: 1 Day Post-Op Procedure(s) (LRB): RIGHT TOTAL HIP ARTHROPLASTY ANTERIOR APPROACH (Right) Foley cath  Advance diet Up with therapy D/C IV fluids Discharge home with home health, eventually when ready  ABLA  Will transfuse with 2 units of blood, between PT sessions Continue iron Will observe  Overweight (BMI 25-29.9) Estimated body mass index is 25.19 kg/(m^2) as calculated from the following:   Height as of this encounter: 5\' 6"  (1.676 m).   Weight as of this encounter: 70.761 kg (156 lb). Patient also counseled that weight may inhibit the healing process Patient counseled that losing weight will help with future health issues       Anastasio AuerbachMatthew S. Askia Hazelip   PAC  02/06/2014, 8:06 AM

## 2014-02-06 NOTE — Progress Notes (Signed)
CRITICAL VALUE ALERT  Critical value received:  hgb 6.6  Date of notification:  02/06/14  Time of notification:  0530  Critical value read back:Yes.    Nurse who received alert:  Haze RushingMosteller RN  MD notified (1st page):  Dimitri PedAmber Constable PA  Time of first page:  0630  MD notified (2nd page):  Time of second page:  Responding MD:  Dimitri PedAmber Constable PA  Time MD responded:  0630

## 2014-02-07 LAB — CBC
HCT: 28.5 % — ABNORMAL LOW (ref 36.0–46.0)
Hemoglobin: 9 g/dL — ABNORMAL LOW (ref 12.0–15.0)
MCH: 23.7 pg — AB (ref 26.0–34.0)
MCHC: 31.6 g/dL (ref 30.0–36.0)
MCV: 75 fL — AB (ref 78.0–100.0)
PLATELETS: 326 10*3/uL (ref 150–400)
RBC: 3.8 MIL/uL — ABNORMAL LOW (ref 3.87–5.11)
RDW: 20.1 % — AB (ref 11.5–15.5)
WBC: 16.5 10*3/uL — ABNORMAL HIGH (ref 4.0–10.5)

## 2014-02-07 LAB — TYPE AND SCREEN
ABO/RH(D): A POS
Antibody Screen: NEGATIVE
Unit division: 0
Unit division: 0

## 2014-02-07 LAB — BASIC METABOLIC PANEL
ANION GAP: 10 (ref 5–15)
BUN: 17 mg/dL (ref 6–23)
CALCIUM: 8.6 mg/dL (ref 8.4–10.5)
CO2: 24 mEq/L (ref 19–32)
Chloride: 104 mEq/L (ref 96–112)
Creatinine, Ser: 0.63 mg/dL (ref 0.50–1.10)
GFR calc Af Amer: >90 mL/min (ref >90)
Glucose, Bld: 132 mg/dL — ABNORMAL HIGH (ref 70–99)
Potassium: 4.4 mEq/L (ref 3.7–5.3)
SODIUM: 138 meq/L (ref 137–147)

## 2014-02-07 NOTE — Progress Notes (Signed)
OT Cancellation Note  Patient Details Name: Sherene SiresLora C Dresser MRN: [id number] DOB: 1946-06-22   Cancelled Treatment:    Reason Eval/Treat Not Completed: OT screened, no needs identified, will sign off. Pt states she has been up to the bathroom and used 3in1 and feels comfortable with toileting transfer. She will have assist by daughters and has all DME. She declines need for acute OT.  Lennox LaityStone, Zyrell Carmean Stafford [phone] 02/07/2014, 9:07 AM

## 2014-02-07 NOTE — Progress Notes (Signed)
Patient ID: Karen SiresLora C Li, female   DOB: 1946-01-23, 68 y.o.   MRN: [id number] Subjective: 2 Days Post-Op Procedure(s) (LRB): RIGHT TOTAL HIP ARTHROPLASTY ANTERIOR APPROACH (Right)    Patient reports pain as mild.  Doing better.  Feels good.  No events  Objective:   VITALS:   Filed Vitals:   02/07/14 0614  BP: 126/74  Pulse: 101  Temp: 98.1 F (36.7 C)  Resp: 16    Neurovascular intact Incision: dressing C/D/I  LABS  Recent Labs  02/06/14 0440 02/07/14 0438  HGB 6.6* 9.0*  HCT 21.9* 28.5*  WBC 15.1* 16.5*  PLT 397 326     Recent Labs  02/06/14 0440 02/07/14 0438  NA 140 138  K 4.2 4.4  BUN 10 17  CREATININE 0.62 0.63  GLUCOSE 140* 132*    No results found for this basename: LABPT, INR,  in the last 72 hours   Assessment/Plan: 2 Days Post-Op Procedure(s) (LRB): RIGHT TOTAL HIP ARTHROPLASTY ANTERIOR APPROACH (Right)   Up with therapy Discharge home with home health today Reviewed plans and goals RTC in 2 weeks

## 2014-02-07 NOTE — Progress Notes (Signed)
Pt to d/c home with [facility]. No DME needs. AVS reviewed and "My Chart" discussed with pt. Pt capable of verbalizing medications, signs and symptoms of infection, and follow-up appointments. Remains hemodynamically stable. No signs and symptoms of distress. Educated pt to return to ER in the case of SOB, dizziness, or chest pain.  

## 2014-02-07 NOTE — Progress Notes (Signed)
Physical Therapy Treatment Patient Details Name: Karen Li MRN: [id number] DOB: 07/27/1945 Today's Date: 02/07/2014    History of Present Illness [name]    PT Comments    Pt feels much better. Plans to DC home with 24/7 cAREGIVERS  Follow Up Recommendations  Home health PT;Supervision for mobility/OOB     Equipment Recommendations  None recommended by PT    Recommendations for Other Services       Precautions / Restrictions Precautions Precautions: Fall Precaution Comments: macular degeneration, low vision Restrictions Weight Bearing Restrictions: No    Mobility  Bed Mobility         Supine to sit: Supervision     General bed mobility comments: instructed in use of  leg lifter, pt reports that she used a bed sheet to self assist earlier, practiced in/out of bed x 2  Transfers   Equipment used: Rolling walker (2 wheeled) Transfers: Sit to/from Stand Sit to Stand: Supervision         General transfer comment: cues for safety, slow down, care for decreased vision, practiced using step atool to get bck into bed, cautioned for safety.  Ambulation/Gait Ambulation/Gait assistance: Supervision Ambulation Distance (Feet): 50 Feet Assistive device: Rolling walker (2 wheeled) Gait Pattern/deviations: Step-through pattern     General Gait Details: cues for sequence and posture., safety, pt has a 4 wheeled RW, did not  use today.    Stairs            Wheelchair Mobility    Modified Rankin (Stroke Patients Only)       Balance                                    Cognition Arousal/Alertness: Awake/alert Behavior During Therapy: Impulsive                        Exercises Total Joint Exercises Ankle Circles/Pumps: AROM;Both;Supine Quad Sets: AROM;Right;10 reps;Supine Short Arc Quad: AROM;Right;10 reps Heel Slides: AROM;Right;10 reps Hip ABduction/ADduction: AAROM;Right;10 reps    General Comments        Pertinent  Vitals/Pain No pain    Home Living                      Prior Function            PT Goals (current goals can now be found in the care plan section) Progress towards PT goals: Progressing toward goals    Frequency  7X/week    PT Plan Current plan remains appropriate    Co-evaluation             End of Session   Activity Tolerance: Patient tolerated treatment well Patient left: in chair;with call bell/phone within reach     Time: [id number] PT Time Calculation (min): 32 min  Charges:  $Gait Training: 8-22 mins $Therapeutic Exercise: 8-22 mins                    G Codes:      Rada HayHill, Ronon Ferger [name] 02/07/2014, 10:23 AM

## 2014-02-14 NOTE — Discharge Summary (Signed)
Physician Discharge Summary  Patient ID: Karen SiresLora C Li MRN: [id number] DOB/AGE: [birth year] 67 y.o.  Admit date: 02/05/2014 Discharge date: 02/07/2014   Procedures:  Procedure(s) (LRB): RIGHT TOTAL HIP ARTHROPLASTY ANTERIOR APPROACH (Right)  Attending Physician:  Dr. Durene RomansMatthew Olin   Admission Diagnoses:   Right hip OA / pain  Discharge Diagnoses:  Principal Problem:   S/P right THA, AA Active Problems:   Overweight (BMI 25.0-29.9)   Acute blood loss anemia  Past Medical History  Diagnosis Date  . Hypertension   . H/O seasonal allergies   . GERD (gastroesophageal reflux disease)     tx. Prevacid" due to Indomethacin use"  . Arthritis     Osteoarthritis- hip, shoulders, fingers  . Transfusion history     9'13 -s/p cervical fusion  . Macular degeneration disease     bilaterally-" legally blind( can not read"    HPI: Karen Li, 68 y.o. female, has a history of pain and functional disability in the right hip(s) due to arthritis and patient has failed non-surgical conservative treatments for greater than 12 weeks to include NSAID's and/or analgesics, use of assistive devices and activity modification. Onset of symptoms was abrupt starting in May 2015 with rapidlly worsening course since that time.The patient noted no past surgery on the right hip(s). Patient currently rates pain in the right hip at 8 out of 10 with activity. Patient has night pain, worsening of pain with activity and weight bearing, trendelenberg gait, pain that interfers with activities of daily living and pain with passive range of motion. Patient has evidence of periarticular osteophytes and joint space narrowing by imaging studies. This condition presents safety issues increasing the risk of falls. There is no current active infection. Risks, benefits and expectations were discussed with the patient. Risks including but not limited to the risk of anesthesia, blood clots, nerve damage, blood vessel damage, failure of  the prosthesis, infection and up to and including death. Patient understand the risks, benefits and expectations and wishes to proceed with surgery.   PCP: Melida GimenezBOSKEN,DONALD, MD   Discharged Condition: good  Hospital Course:  Patient underwent the above stated procedure on 02/05/2014. Patient tolerated the procedure well and brought to the recovery room in good condition and subsequently to the floor.  POD #1 BP: 109/70 ; Pulse: 103 ; Temp: 98.3 F (36.8 C) ; Resp: 16  Patient reports pain as mild, pain controlled with medication. No events throughout the night. Feeling a little weak this morning. Transfused with 2 units of blood. Dorsiflexion/plantar flexion intact, incision: dressing C/D/I, no cellulitis present and compartment soft.   LABS  Basename    HGB  6.6  HCT  21.9   POD #2  BP: 126/74 ; Pulse: 101 ; Temp: 98.1 F (36.7 C) ; Resp: 16 Patient reports pain as mild. Doing better after 2 units of blood. Feels good. No events. Ready to be discharged home. Dorsiflexion/plantar flexion intact, incision: dressing C/D/I, no cellulitis present and compartment soft.   LABS  Basename    HGB  9.0  HCT  28.5    Discharge Exam: General appearance: alert, cooperative and no distress Extremities: Homans sign is negative, no sign of DVT, no edema, redness or tenderness in the calves or thighs and no ulcers, gangrene or trophic changes  Disposition: Home with follow up in 2 weeks   Follow-up Information   Follow up with Shelda PalLIN,Dorothee Napierkowski D, MD. Schedule an appointment as soon as possible for a visit in 2 weeks.  Specialty:  Orthopedic Surgery   Contact information:   [address] [place] [address] [phone]       Discharge Instructions   Call MD / Call 911    Complete by:  As directed   If you experience chest pain or shortness of breath, CALL 911 and be transported to the hospital emergency room.  If you develope a fever above 101 F, pus (white drainage) or  increased drainage or redness at the wound, or calf pain, call your surgeon's office.     Change dressing    Complete by:  As directed   Maintain surgical dressing for 10-14 days, or until follow up in the clinic.     Constipation Prevention    Complete by:  As directed   Drink plenty of fluids.  Prune juice may be helpful.  You may use a stool softener, such as Colace (over the counter) 100 mg twice a day.  Use MiraLax (over the counter) for constipation as needed.     Diet - low sodium heart healthy    Complete by:  As directed      Discharge instructions    Complete by:  As directed   Maintain surgical dressing for 10-14 days, or until follow up in the clinic. Follow up in 2 weeks at [facility]. Call with any questions or concerns.     Increase activity slowly as tolerated    Complete by:  As directed      TED hose    Complete by:  As directed   Use stockings (TED hose) for 2 weeks on both leg(s).  You may remove them at night for sleeping.     Weight bearing as tolerated    Complete by:  As directed   Laterality:  right  Extremity:  Lower             Medication List    STOP taking these medications       HYDROcodone-acetaminophen 5-325 MG per tablet  Commonly known as:  NORCO/VICODIN  Replaced by:  HYDROcodone-acetaminophen 7.5-325 MG per tablet     indomethacin 50 MG capsule  Commonly known as:  INDOCIN      TAKE these medications       acidophilus Caps capsule  Take 1 capsule by mouth daily.     alendronate 70 MG tablet  Commonly known as:  FOSAMAX  Take 70 mg by mouth once a week. Take with a full glass of water on an empty stomach.     amLODipine 10 MG tablet  Commonly known as:  NORVASC  Take 10 mg by mouth every morning.     aspirin 325 MG EC tablet  Take 1 tablet (325 mg total) by mouth 2 (two) times daily.     atorvastatin 40 MG tablet  Commonly known as:  LIPITOR  Take 40 mg by mouth every evening.     Coenzyme Q10 200 MG capsule    Take 200 mg by mouth daily.     DSS 100 MG Caps  Take 100 mg by mouth 2 (two) times daily.     ferrous sulfate 325 (65 FE) MG tablet  Take 1 tablet (325 mg total) by mouth 3 (three) times daily after meals.     HYDROcodone-acetaminophen 7.5-325 MG per tablet  Commonly known as:  NORCO  Take 1-2 tablets by mouth every 4 (four) hours.     lansoprazole 15 MG capsule  Commonly known as:  PREVACID  Take 15 mg by mouth daily at 12 noon.     Lutein 20 MG Tabs  Take 1 tablet by mouth daily.     methocarbamol 500 MG tablet  Commonly known as:  ROBAXIN  Take 1 tablet (500 mg total) by mouth every 6 (six) hours as needed for muscle spasms.     oxymetazoline 0.05 % nasal spray  Commonly known as:  AFRIN  Place 1 spray into both nostrils 2 (two) times daily as needed for congestion.     polyethylene glycol packet  Commonly known as:  MIRALAX / GLYCOLAX  Take 17 g by mouth 2 (two) times daily.     venlafaxine XR 75 MG 24 hr capsule  Commonly known as:  EFFEXOR-XR  Take 75-150 mg by mouth daily with breakfast.     vitamin B-12 500 MCG tablet  Commonly known as:  CYANOCOBALAMIN  Take 500 mcg by mouth daily.     Vitamin D3 5000 UNITS Tabs  Take 5,000 Units by mouth daily.     zolpidem 5 MG tablet  Commonly known as:  AMBIEN  Take 5 mg by mouth at bedtime as needed for sleep.         Signed: Anastasio Auerbach. Kiandre Spagnolo   PA-C  02/14/2014, 11:47 AM

## 2015-06-17 ENCOUNTER — Encounter (HOSPITAL_BASED_OUTPATIENT_CLINIC_OR_DEPARTMENT_OTHER): Payer: Self-pay

## 2015-06-17 ENCOUNTER — Emergency Department (HOSPITAL_BASED_OUTPATIENT_CLINIC_OR_DEPARTMENT_OTHER)
Admission: EM | Admit: 2015-06-17 | Discharge: 2015-06-17 | Disposition: A | Discharge status: Home / Self Care | Payer: Medicare Other | Attending: Emergency Medicine | Admitting: Emergency Medicine

## 2015-06-17 ENCOUNTER — Emergency Department (HOSPITAL_BASED_OUTPATIENT_CLINIC_OR_DEPARTMENT_OTHER): Payer: Medicare Other

## 2015-06-17 DIAGNOSIS — Z8669 Personal history of other diseases of the nervous system and sense organs: Secondary | ICD-10-CM | POA: Insufficient documentation

## 2015-06-17 DIAGNOSIS — W01198A Fall on same level from slipping, tripping and stumbling with subsequent striking against other object, initial encounter: Secondary | ICD-10-CM | POA: Diagnosis not present

## 2015-06-17 DIAGNOSIS — Y9289 Other specified places as the place of occurrence of the external cause: Secondary | ICD-10-CM | POA: Diagnosis not present

## 2015-06-17 DIAGNOSIS — G44319 Acute post-traumatic headache, not intractable: Secondary | ICD-10-CM

## 2015-06-17 DIAGNOSIS — Z79899 Other long term (current) drug therapy: Secondary | ICD-10-CM | POA: Diagnosis not present

## 2015-06-17 DIAGNOSIS — I10 Essential (primary) hypertension: Secondary | ICD-10-CM | POA: Insufficient documentation

## 2015-06-17 DIAGNOSIS — Y998 Other external cause status: Secondary | ICD-10-CM | POA: Diagnosis not present

## 2015-06-17 DIAGNOSIS — W19XXXA Unspecified fall, initial encounter: Secondary | ICD-10-CM

## 2015-06-17 DIAGNOSIS — Y9389 Activity, other specified: Secondary | ICD-10-CM | POA: Diagnosis not present

## 2015-06-17 DIAGNOSIS — S0990XA Unspecified injury of head, initial encounter: Secondary | ICD-10-CM | POA: Insufficient documentation

## 2015-06-17 DIAGNOSIS — M1990 Unspecified osteoarthritis, unspecified site: Secondary | ICD-10-CM | POA: Insufficient documentation

## 2015-06-17 DIAGNOSIS — K219 Gastro-esophageal reflux disease without esophagitis: Secondary | ICD-10-CM | POA: Insufficient documentation

## 2015-06-17 NOTE — ED Provider Notes (Signed)
CSN: [id number]     Arrival date & time 06/17/15  1644 History   First MD Initiated Contact with Patient 06/17/15 1703     Chief Complaint  Patient presents with  . Head Injury     (Consider location/radiation/quality/duration/timing/severity/associated sxs/prior Treatment) Patient is a 69 y.o. female presenting with head injury.  Head Injury Location:  Occipital (right side) Time since incident:  2 hours Mechanism of injury comment:  Taking trash in, can knocked and fell backwards fell on bottom then hit head Pain details:    Quality:  Pressure   Severity:  Mild   Duration:  2 hours   Timing:  Constant   Progression:  Unchanged Chronicity:  New Relieved by:  Ice Worsened by:  Nothing tried Ineffective treatments:  None tried Associated symptoms: headache   Associated symptoms: no blurred vision, no difficulty breathing, no double vision, no focal weakness, no loss of consciousness, no memory loss, no nausea, no neck pain, no numbness and no vomiting   Risk factors: aspirin     Past Medical History  Diagnosis Date  . Hypertension   . H/O seasonal allergies   . GERD (gastroesophageal reflux disease)     tx. Prevacid" due to Indomethacin use"  . Arthritis     Osteoarthritis- hip, shoulders, fingers  . Transfusion history     9'13 -s/p cervical fusion  . Macular degeneration disease     bilaterally-" legally blind( can not read"   Past Surgical History  Procedure Laterality Date  . Tonsillectomy    . Breast surgery      '82- bil. breast cystectomy-benign  . Foot surgery Left     8'06-retained screw left heel.   . Back surgery      4'09,8'115'07,7'12 - Lumbar fusion -rods/ screws retained  . Cervical fusion      9'13 Cervical fusion [place] -retained plates/screws  . Abdominal hysterectomy      7'15 - [facility]-"prolapse"  . Cataract surgery Bilateral     "legally blind" can't read , can see limited  . Total hip arthroplasty Right 02/05/2014    Procedure: RIGHT  TOTAL HIP ARTHROPLASTY ANTERIOR APPROACH;  Surgeon: Shelda PalMatthew D Olin, MD;  Location: [facility]S;  Service: Orthopedics;  Laterality: Right;   No family history on file. Social History  Substance Use Topics  . Smoking status: Never Smoker   . Smokeless tobacco: None  . Alcohol Use: No     Comment: rare -social    OB History    No data available     Review of Systems  Constitutional: Negative for fever.  HENT: Negative for sore throat.   Eyes: Negative for blurred vision, double vision and visual disturbance.  Respiratory: Negative for cough and shortness of breath.   Cardiovascular: Negative for chest pain.  Gastrointestinal: Negative for nausea, vomiting and abdominal pain.  Genitourinary: Negative for difficulty urinating.  Musculoskeletal: Negative for back pain and neck pain.  Skin: Negative for rash.  Neurological: Positive for headaches. Negative for focal weakness, loss of consciousness, syncope and numbness.  Psychiatric/Behavioral: Negative for memory loss.      Allergies  Review of patient's allergies indicates no known allergies.  Home Medications   Prior to Admission medications   Medication Sig Start Date End Date Taking? Authorizing Provider  acidophilus (RISAQUAD) CAPS capsule Take 1 capsule by mouth daily.    Historical Provider, MD  alendronate (FOSAMAX) 70 MG tablet Take 70 mg by mouth once a week. Take with a  full glass of water on an empty stomach.    Historical Provider, MD  amLODipine (NORVASC) 10 MG tablet Take 10 mg by mouth every morning.    Historical Provider, MD  atorvastatin (LIPITOR) 40 MG tablet Take 40 mg by mouth every evening.    Historical Provider, MD  Cholecalciferol (VITAMIN D3) 5000 UNITS TABS Take 5,000 Units by mouth daily.    Historical Provider, MD  Coenzyme Q10 200 MG capsule Take 200 mg by mouth daily.    Historical Provider, MD  ferrous sulfate 325 (65 FE) MG tablet Take 1 tablet (325 mg total) by mouth 3 (three) times daily after  meals. 02/06/14   Lanney Gins, PA-C  HYDROcodone-acetaminophen (NORCO) 7.5-325 MG per tablet Take 1-2 tablets by mouth every 4 (four) hours. 02/06/14   Lanney Gins, PA-C  lansoprazole (PREVACID) 15 MG capsule Take 15 mg by mouth daily at 12 noon.    Historical Provider, MD  Lutein 20 MG TABS Take 1 tablet by mouth daily.    Historical Provider, MD  oxymetazoline (AFRIN) 0.05 % nasal spray Place 1 spray into both nostrils 2 (two) times daily as needed for congestion.    Historical Provider, MD  polyethylene glycol (MIRALAX / GLYCOLAX) packet Take 17 g by mouth 2 (two) times daily. 02/06/14   Lanney Gins, PA-C  venlafaxine XR (EFFEXOR-XR) 75 MG 24 hr capsule Take 75-150 mg by mouth daily with breakfast.    Historical Provider, MD  vitamin B-12 (CYANOCOBALAMIN) 500 MCG tablet Take 500 mcg by mouth daily.    Historical Provider, MD  zolpidem (AMBIEN) 5 MG tablet Take 5 mg by mouth at bedtime as needed for sleep.    Historical Provider, MD   BP 144/95 mmHg  Pulse 113  Temp(Src) 98.1 F (36.7 C) (Oral)  Resp 20  Ht  (1.676 m)  Wt 155 lb (70.308 kg)  BMI 25.03 kg/m2  SpO2 98% Physical Exam  Constitutional: She is oriented to person, place, and time. She appears well-developed and well-nourished. No distress.  HENT:  Head: Normocephalic.  Mouth/Throat: No oropharyngeal exudate.  Area of tenderness right occiput  Eyes: Conjunctivae and EOM are normal. Pupils are equal, round, and reactive to light.  Neck: Normal range of motion.  Cardiovascular: Normal rate, regular rhythm, normal heart sounds and intact distal pulses.  Exam reveals no gallop and no friction rub.   No murmur heard. Pulmonary/Chest: Effort normal and breath sounds normal. No respiratory distress. She has no wheezes. She has no rales.  Abdominal: Soft. She exhibits no distension. There is no tenderness. There is no guarding.  Musculoskeletal: She exhibits no edema or tenderness.  Neurological: She is alert and oriented  to person, place, and time. She has normal strength. No cranial nerve deficit or sensory deficit. Coordination and gait normal. GCS eye subscore is 4. GCS verbal subscore is 5. GCS motor subscore is 6.  Skin: Skin is warm and dry. No rash noted. She is not diaphoretic. No erythema.  Nursing note and vitals reviewed.   ED Course  Procedures (including critical care time) Labs Review Labs Reviewed - No data to display  Imaging Review Ct Head Wo Contrast  06/17/2015  CLINICAL DATA:  Injury, fell at 1500 hours onto concrete hitting top of RIGHT side of head, no loss of consciousness EXAM: CT HEAD WITHOUT CONTRAST TECHNIQUE: Contiguous axial images were obtained from the base of the skull through the vertex without intravenous contrast. COMPARISON:  None FINDINGS: Minimal atrophy. Normal ventricular morphology.  No midline shift or mass effect. Scattered subcortical white matter hypodensities likely mild small vessel chronic ischemic change. No intracranial hemorrhage, mass lesion or evidence acute infarction. No extra-axial fluid collections. Visualized paranasal sinuses and mastoid air cells clear. No acute osseous findings. IMPRESSION: Minimal atrophy and small vessel chronic ischemic changes of deep cerebral white matter. No acute intracranial abnormalities. Electronically Signed   By: Ulyses Southward M.D.   On: 06/17/2015 17:43   I have personally reviewed and evaluated these images and lab results as part of my medical decision-making.   EKG Interpretation None      MDM   Final diagnoses:  Fall, initial encounter  Acute post-traumatic headache, not intractable   69 year old female with a history of hypertension, legally blind presents with concern of mechanical fall from standing with hitting her head. Patient does not have any midline neck pain, no numbness or weakness, and low suspicion for cervical spine, thoracic or lumbar spine injury. Denies any other areas of pain or,. CT head was done  given patient's age and head trauma and showed no acute intracranial findings. Recommend follow up with PCP. Patient discharged in stable condition with understanding of reasons to return.    Alvira Monday, MD 06/17/15 [phone]

## 2015-06-17 NOTE — ED Notes (Addendum)
Patient transported to CT and returned. Ambulatory with steady gait and without need for assist

## 2015-06-17 NOTE — ED Notes (Signed)
D/c home with family. Ambulatory with steady gait

## 2015-06-17 NOTE — Discharge Instructions (Signed)

## 2015-06-17 NOTE — ED Notes (Addendum)
Trip/fall approx 3pm today-struck head on driveway-no break in skin noted-pt with ice pack to right parietal upon arriva -no LOC with fall-NAD-steady gait

## 2015-08-19 IMAGING — CR DG CHEST 2V
2 series · 2 of 2 positions shown · non-contrast
Comparison: None.

CLINICAL DATA: Hypertension. Gastroesophageal reflux disease.
Preoperative for hip surgery.

EXAM:
CHEST  2 VIEW

[w chest pa]
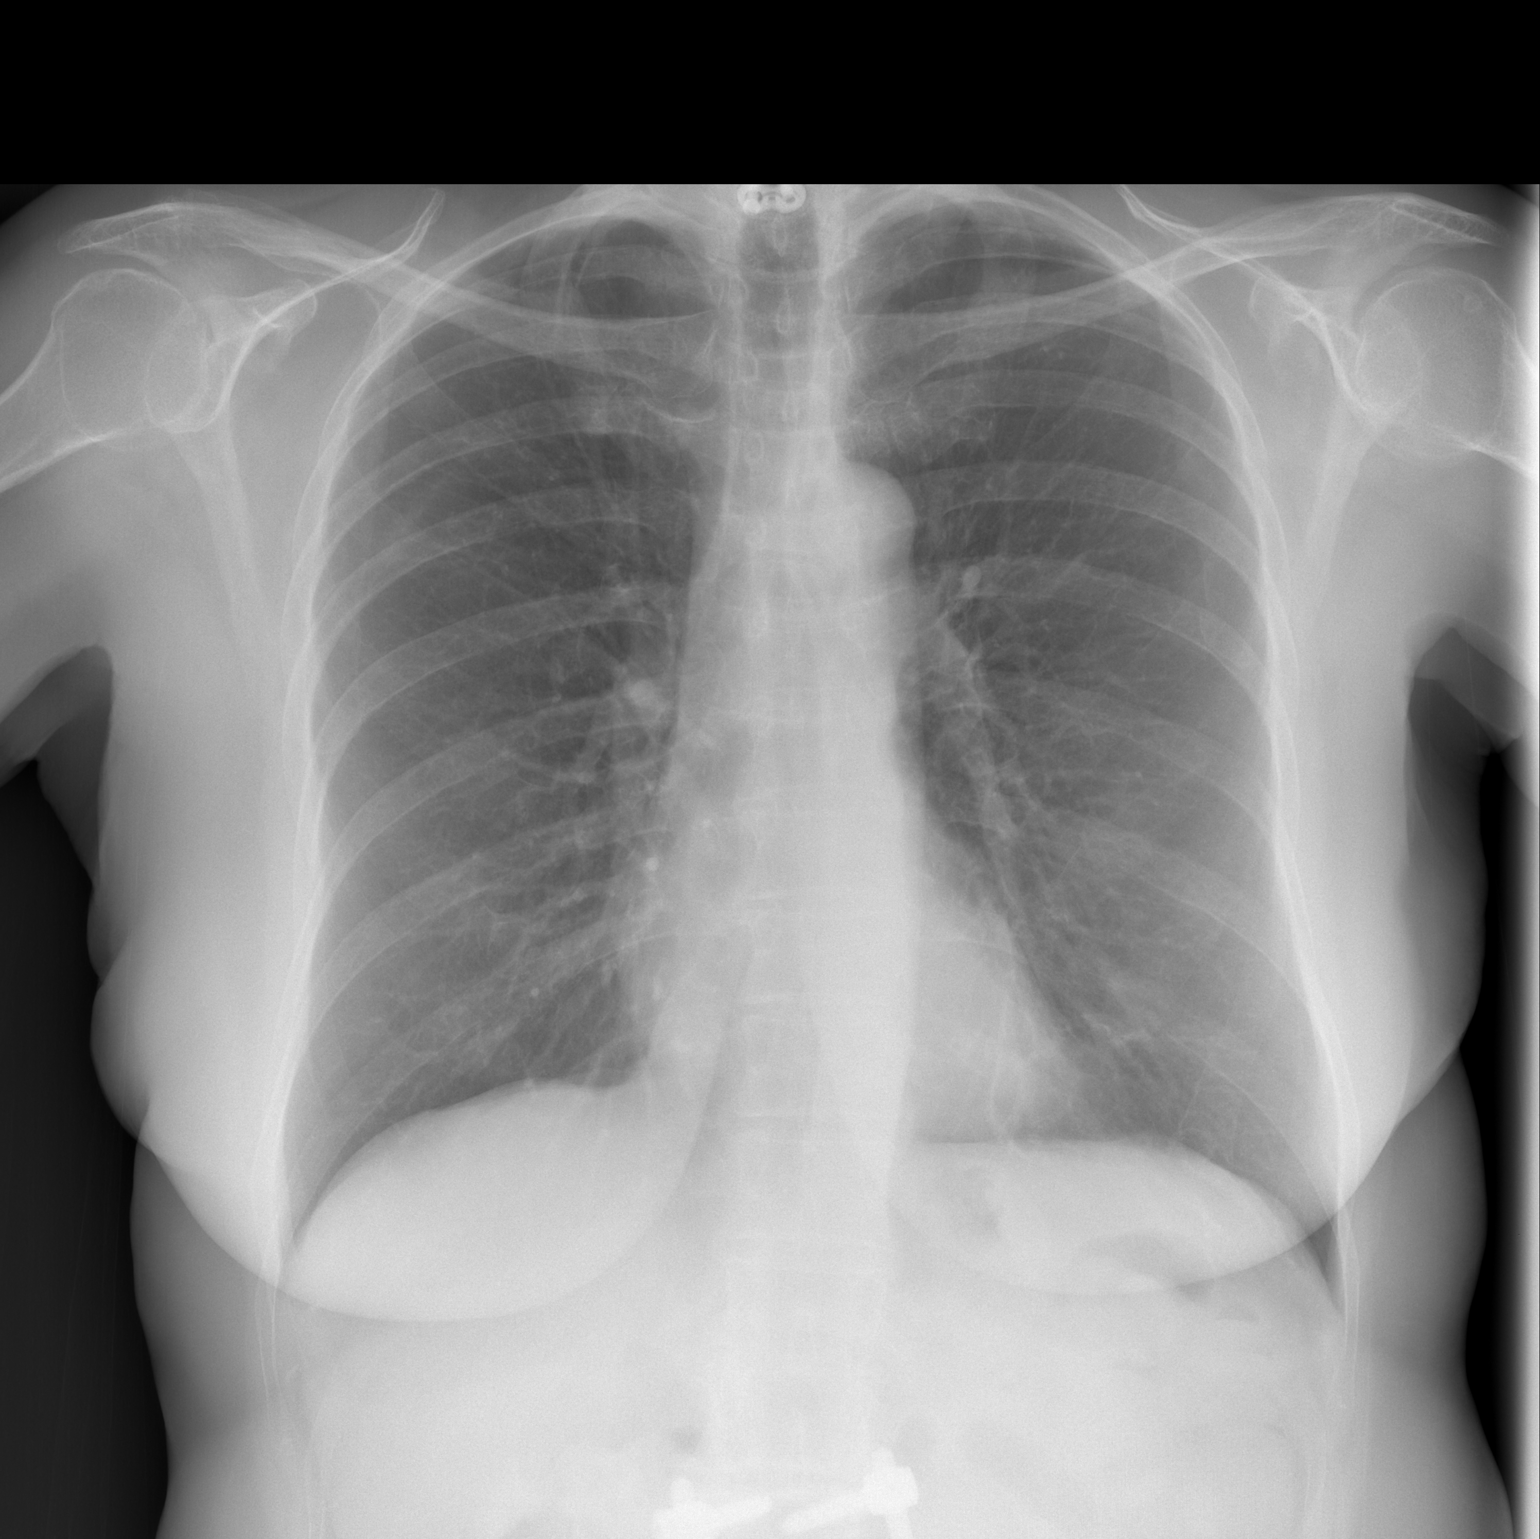

[w chest lat]
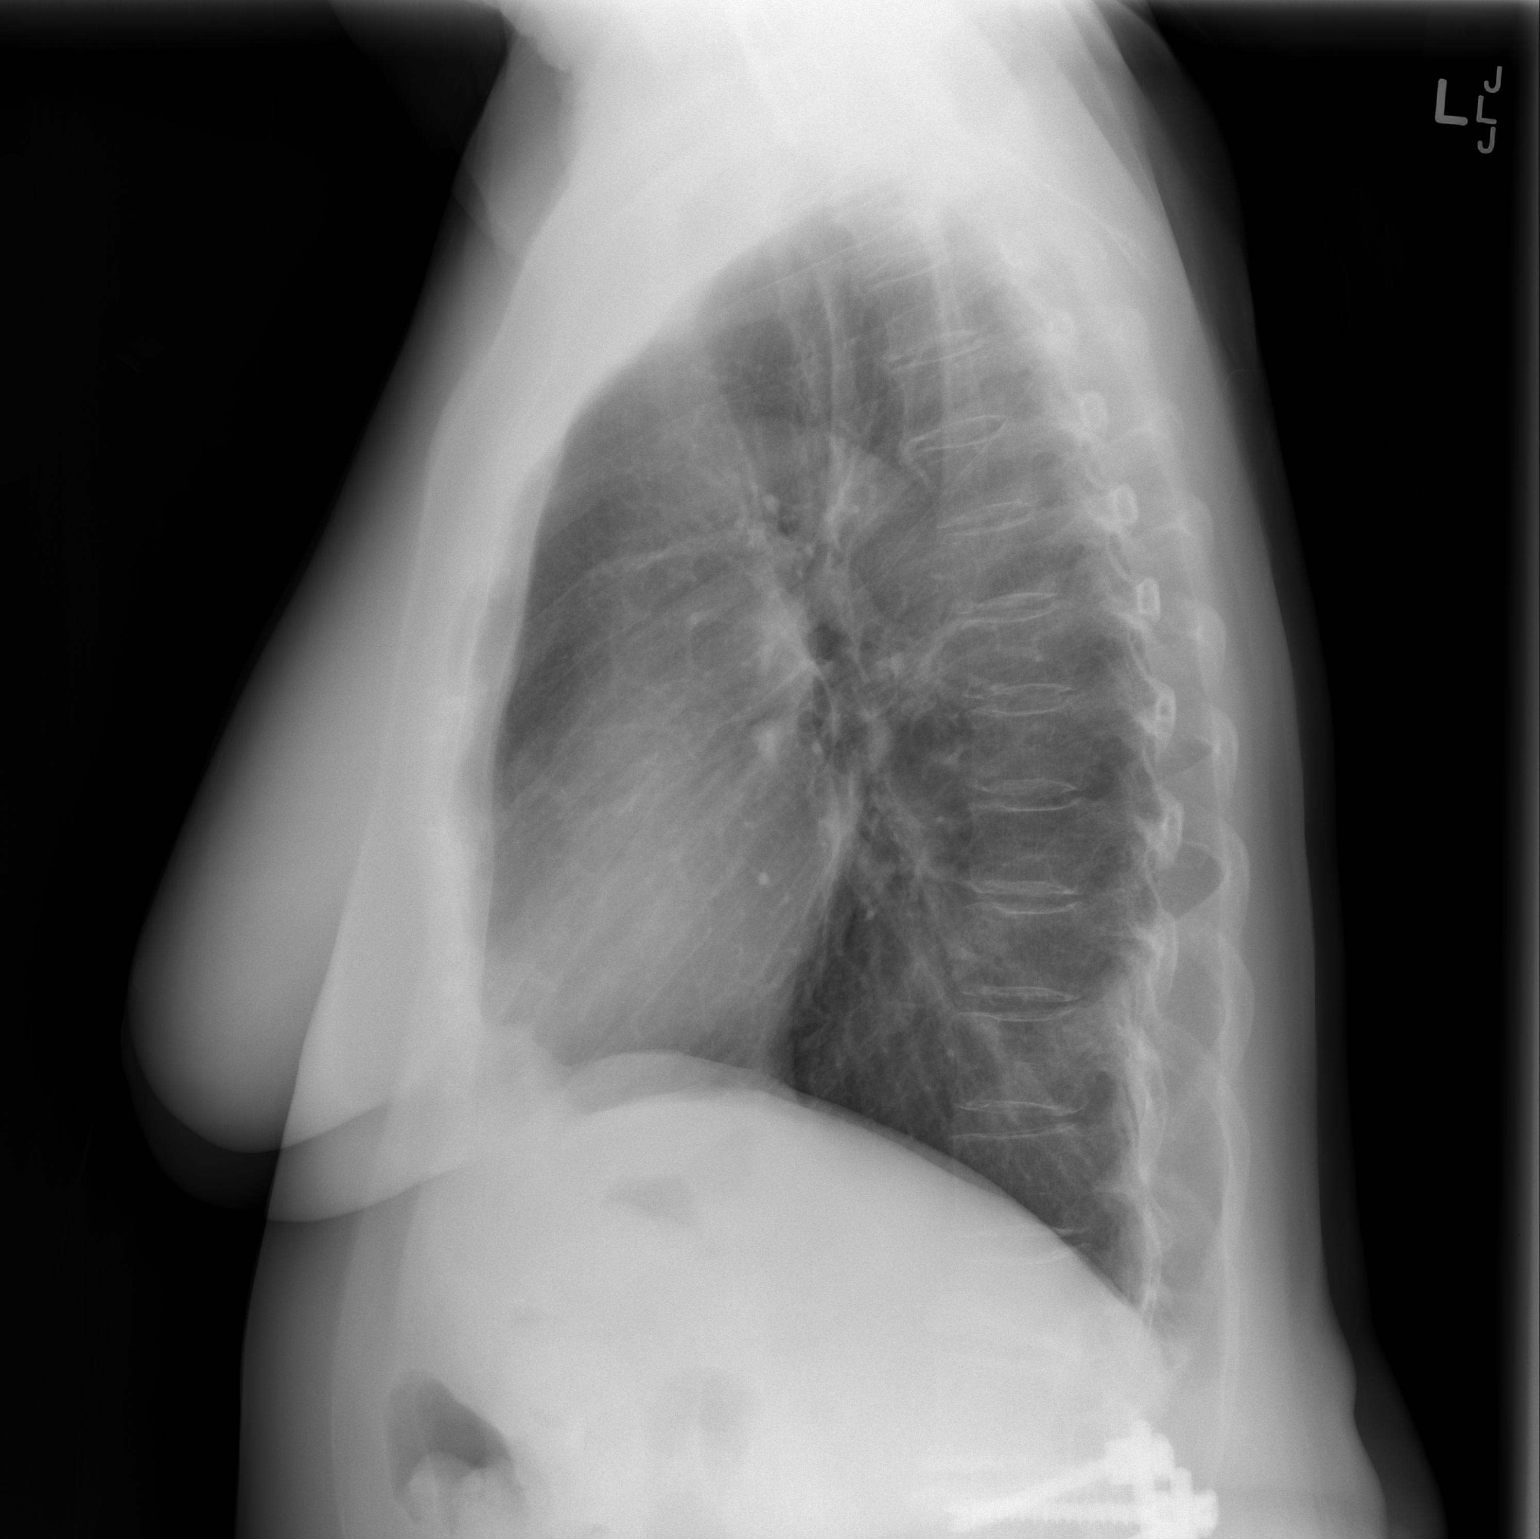

[2 of 2 positions shown; findings below may reference images not displayed]

FINDINGS: Linear subsegmental atelectasis or scarring in the left lower lobe.
Cardiac and mediastinal margins appear normal. Lower cervical plate
and screw fixator. Subtle linear opacity peripherally over the right
upper lobe is probably artifactual or may conceivably represent
minimal scarring.
IMPRESSION: 1. Left basilar subsegmental atelectasis or scar. Equivocal
scarring, right upper lobe. No acute findings.

## 2015-08-30 IMAGING — DX DG PORTABLE PELVIS
1 series · 1 of 1 positions shown · non-contrast
Comparison: None.

CLINICAL DATA: Hip pain.

EXAM:
DG C-ARM 1-60 MIN - NRPT [facility]; PORTABLE RIGHT HIP - 1 VIEW; PORTABLE
PELVIS 1-2 VIEWS

[pelvis ap]
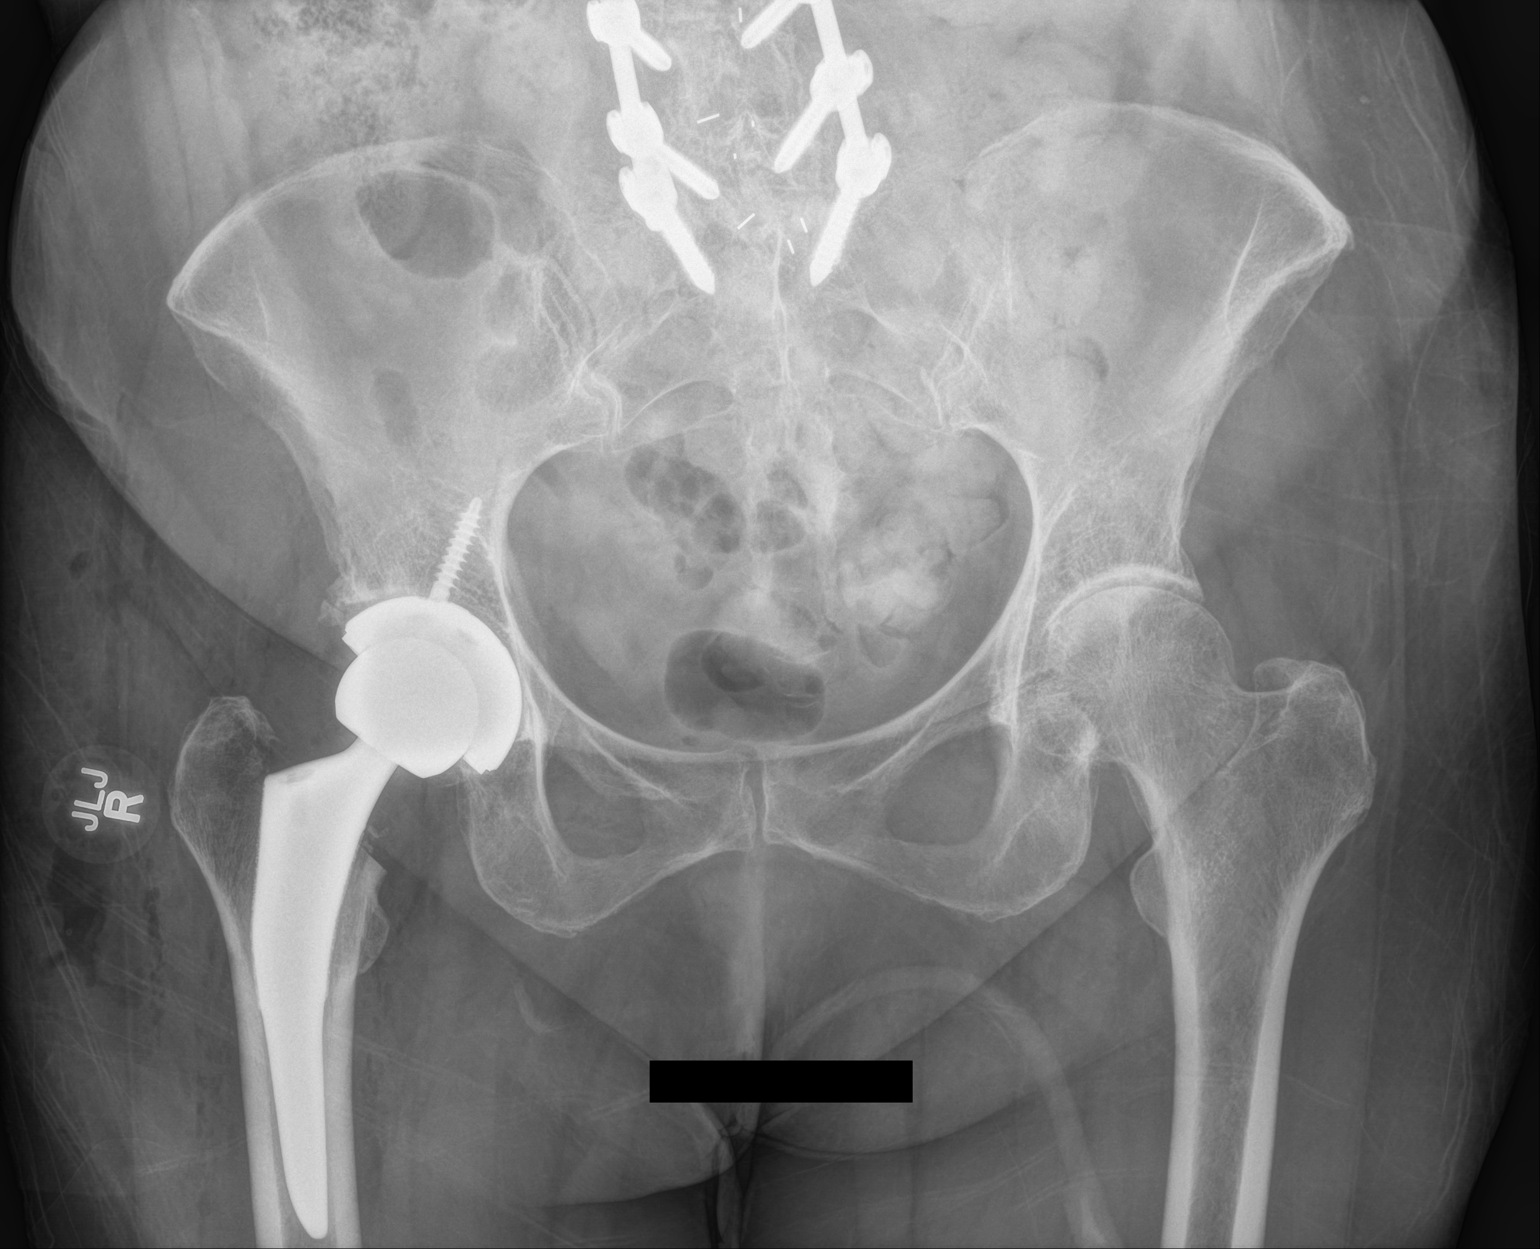

[1 of 1 positions shown; findings below may reference images not displayed]

FINDINGS: Radiographs demonstrates satisfactory position and alignment status
post RIGHT THR from anterior approach. Femoral and acetabular
components are appropriately located.
IMPRESSION: Satisfactory postoperative appearance.

## 2017-01-08 IMAGING — CT CT HEAD W/O CM
1 series · 16 of 30 positions shown, 20 images · non-contrast
Comparison: None

CLINICAL DATA: Injury, fell at 4722 hours onto concrete hitting top
of RIGHT side of head, no loss of consciousness

EXAM:
CT HEAD WITHOUT CONTRAST
TECHNIQUE: Contiguous axial images were obtained from the base of the skull
through the vertex without intravenous contrast.

[Series 2: head 4.8 h37s · axial · 0.41mm/px · z∈[-165,-27]mm · 16 of 33 slices shown, 20 images]
[im 2/33  brain]
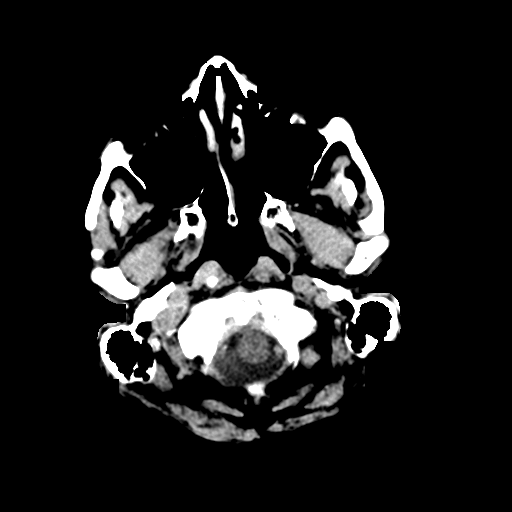
[im 2/33  bone]
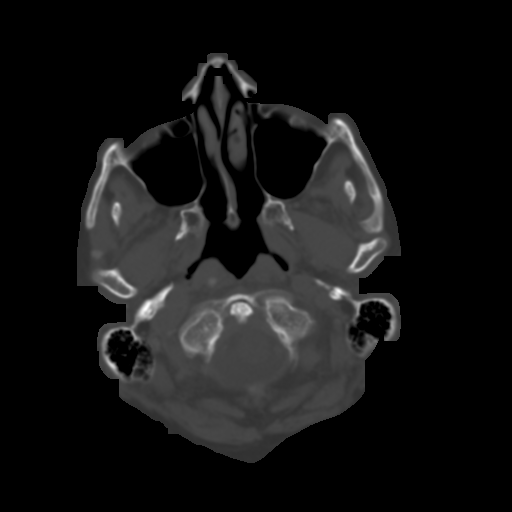
[im 4/33  brain]
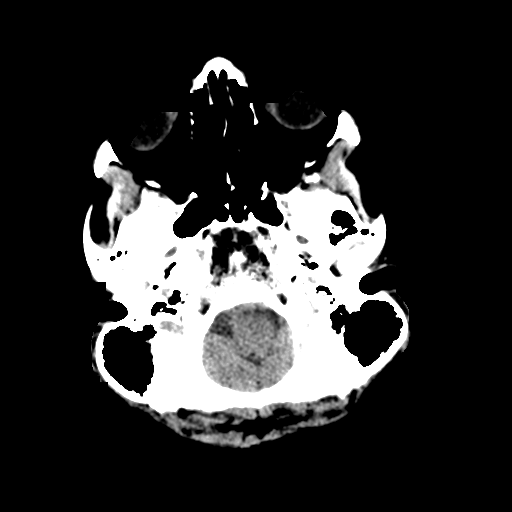
[im 6/33  brain]
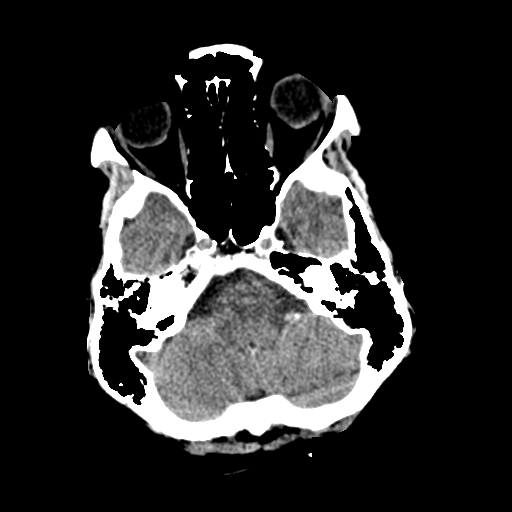
[im 8/33  brain]
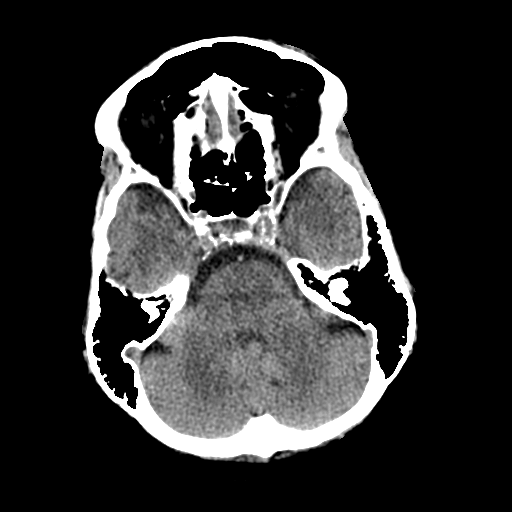
[im 9/33  brain]
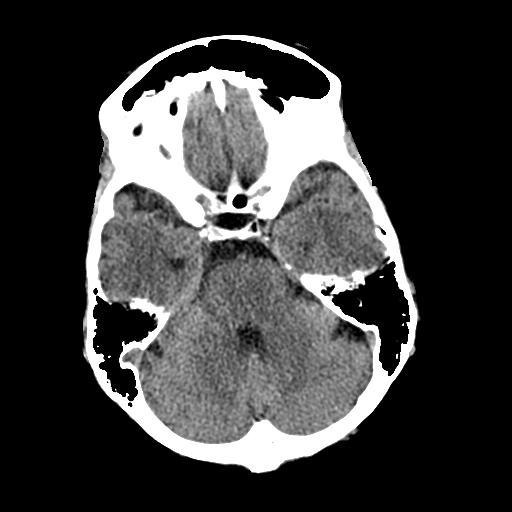
[im 9/33  bone]
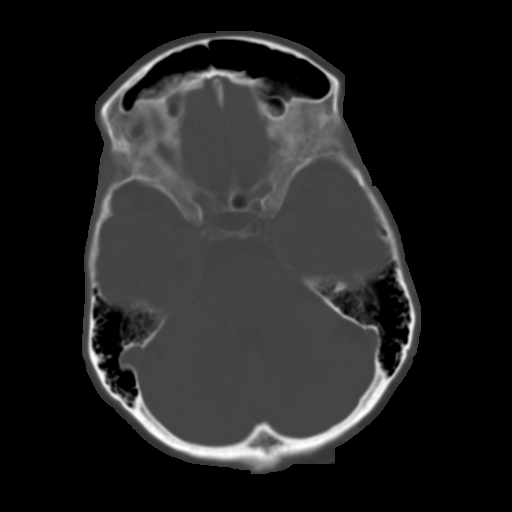
[im 12/33  brain]
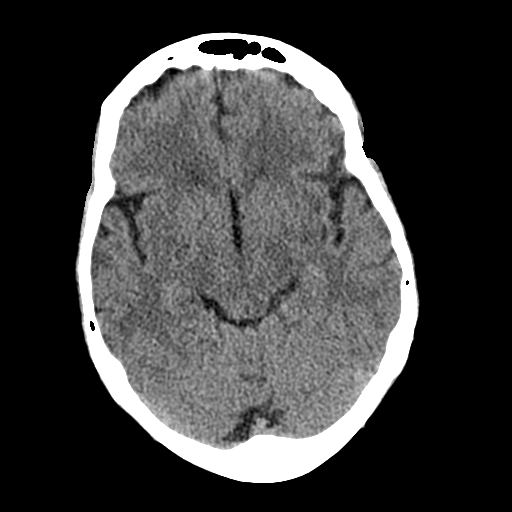
[im 14/33  brain]
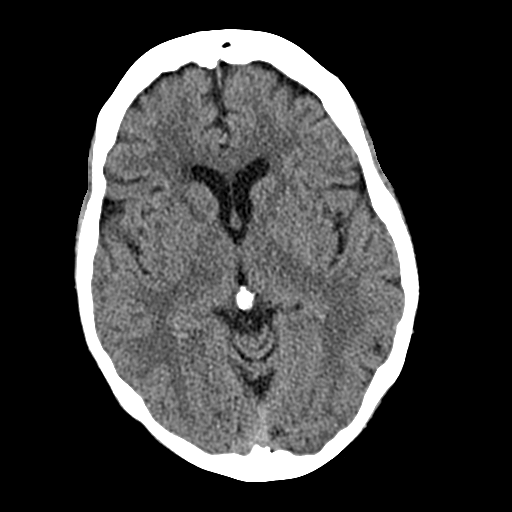
[im 16/33  brain]
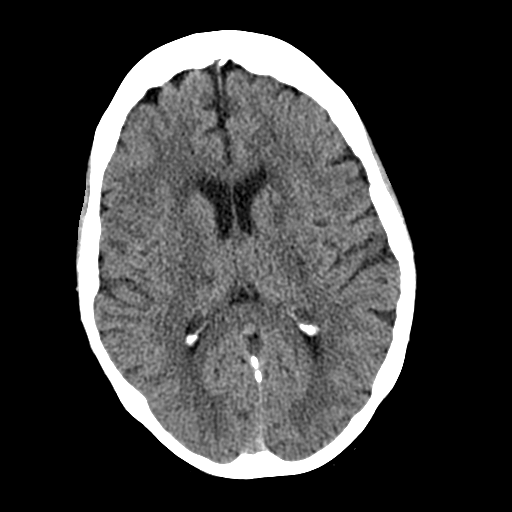
[im 17/33  brain]
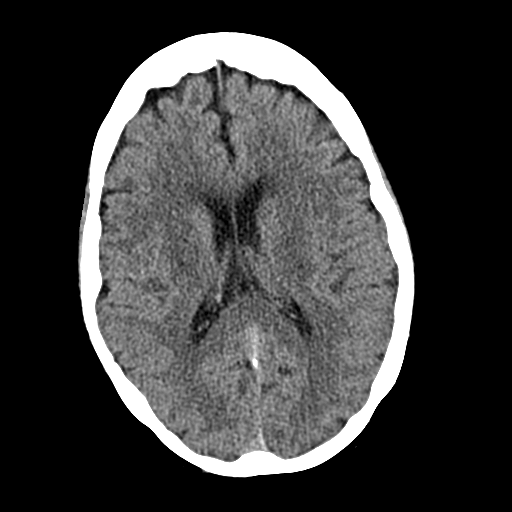
[im 17/33  bone]
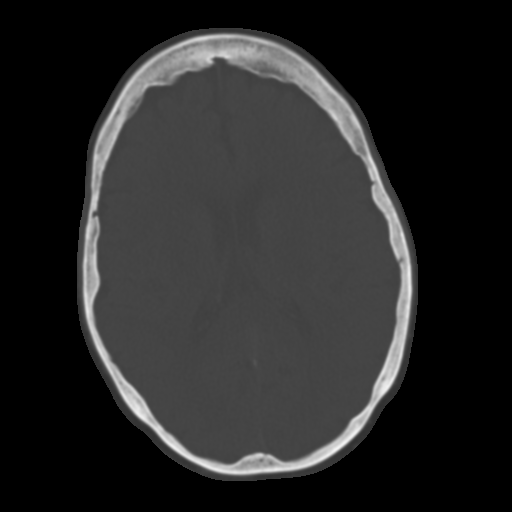
[im 19/33  brain]
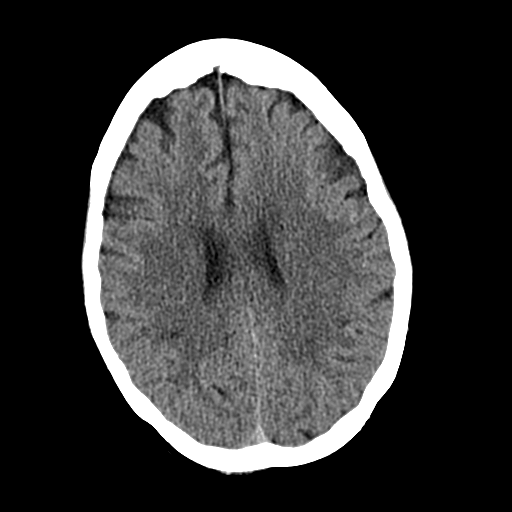
[im 21/33  brain]
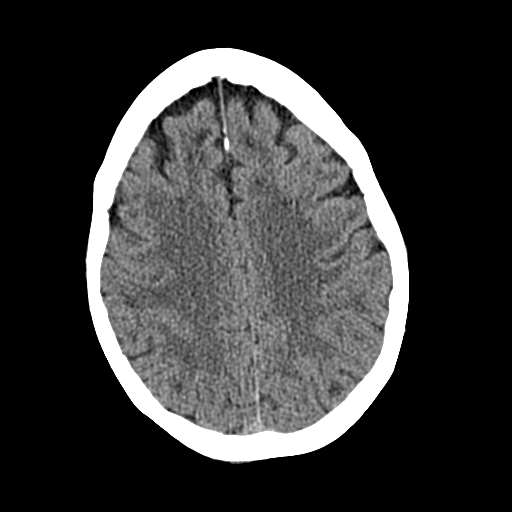
[im 24/33  brain]
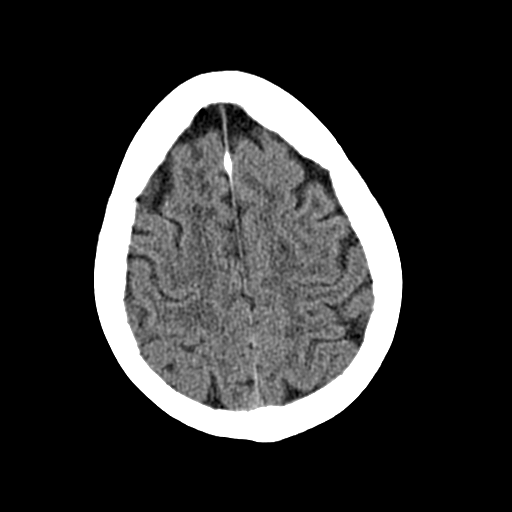
[im 25/33  brain]
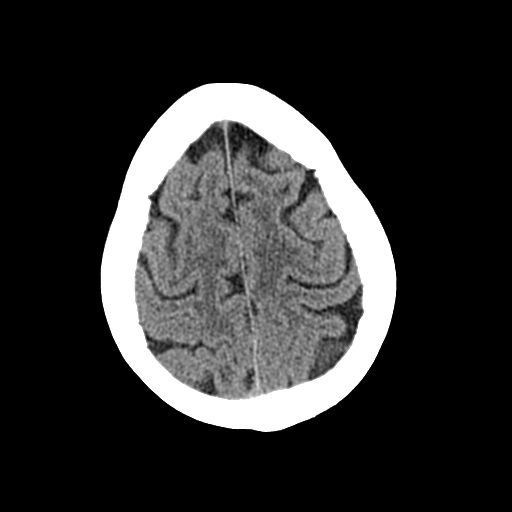
[im 25/33  bone]
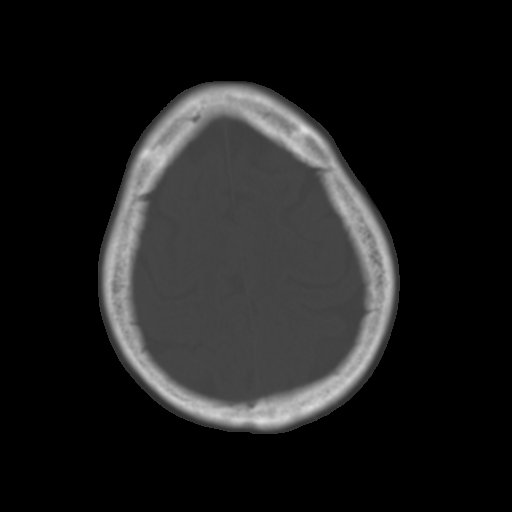
[im 27/33  brain]
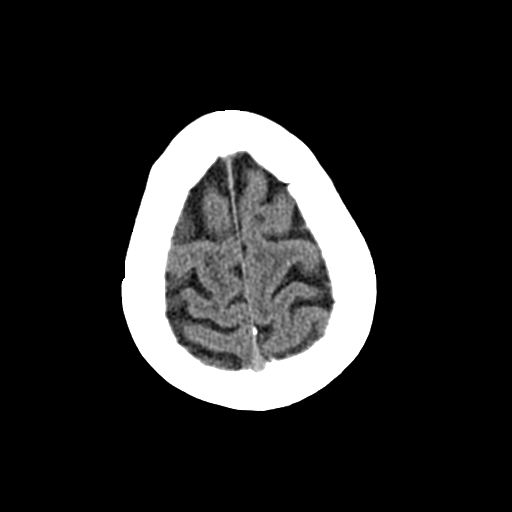
[im 29/33  brain]
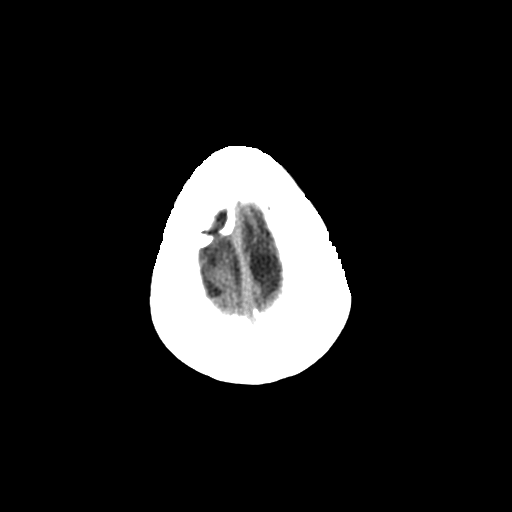
[im 31/33  brain]
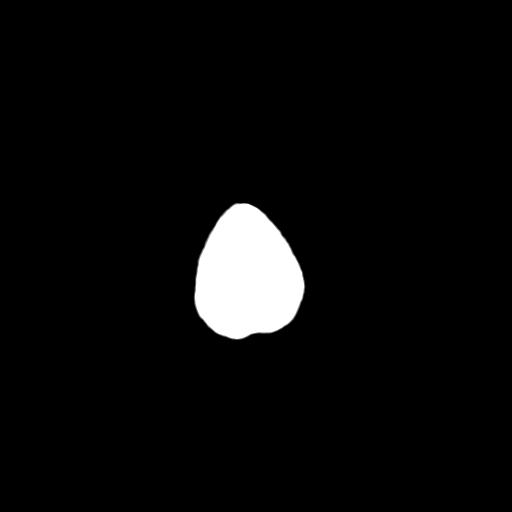

[16 of 30 positions shown; findings below may reference images not displayed]

FINDINGS: Minimal atrophy.

Normal ventricular morphology.

No midline shift or mass effect.

Scattered subcortical white matter hypodensities likely mild small
vessel chronic ischemic change.

No intracranial hemorrhage, mass lesion or evidence acute
infarction.

No extra-axial fluid collections.

Visualized paranasal sinuses and mastoid air cells clear.

No acute osseous findings.
IMPRESSION: Minimal atrophy and small vessel chronic ischemic changes of deep
cerebral white matter.

No acute intracranial abnormalities.
